# Patient Record
Sex: Female | Born: 1950 | Race: White | Hispanic: No | Smoking: Never smoker
Health system: Southern US, Community
[De-identification: ages and names within clinical notes are randomized; demographics above are authoritative.]

## PROBLEM LIST (undated history)

## (undated) DIAGNOSIS — M8580 Other specified disorders of bone density and structure, unspecified site: Secondary | ICD-10-CM

## (undated) DIAGNOSIS — B009 Herpesviral infection, unspecified: Secondary | ICD-10-CM

## (undated) DIAGNOSIS — Z8639 Personal history of other endocrine, nutritional and metabolic disease: Secondary | ICD-10-CM

## (undated) DIAGNOSIS — Z87898 Personal history of other specified conditions: Secondary | ICD-10-CM

## (undated) HISTORY — DX: Personal history of other specified conditions: Z87.898

## (undated) HISTORY — DX: Other specified disorders of bone density and structure, unspecified site: M85.80

## (undated) HISTORY — DX: Personal history of other endocrine, nutritional and metabolic disease: Z86.39

## (undated) HISTORY — DX: Herpesviral infection, unspecified: B00.9

## (undated) HISTORY — PX: SALPINGECTOMY: SHX328

## (undated) HISTORY — PX: WRIST SURGERY: SHX841

---

## 1982-10-04 HISTORY — PX: CERVICAL CONIZATION W/BX: SHX1330

## 1986-10-04 HISTORY — PX: TUBAL LIGATION: SHX77

## 2008-11-27 ENCOUNTER — Other Ambulatory Visit: Admission: RE | Admit: 2008-11-27 | Discharge: 2008-11-27 | Payer: Self-pay | Admitting: Obstetrics & Gynecology

## 2009-01-23 ENCOUNTER — Ambulatory Visit (HOSPITAL_COMMUNITY): Admission: RE | Admit: 2009-01-23 | Discharge: 2009-01-23 | Payer: Self-pay | Admitting: Gastroenterology

## 2009-01-26 LAB — HM DEXA SCAN

## 2010-10-24 ENCOUNTER — Encounter: Payer: Self-pay | Admitting: Specialist

## 2011-02-16 NOTE — Op Note (Signed)
NAME:  [facility] MD, [facility]             ACCOUNT NO.:  [id number]   MEDICAL RECORD NO.:  [id number]          PATIENT TYPE:  AMB   LOCATION:  ENDO                         FACILITY:  [facility]   PHYSICIAN:  John C. Madilyn Fireman, M.D.    DATE OF BIRTH:  1950-12-22   DATE OF PROCEDURE:  DATE OF DISCHARGE:                               OPERATIVE REPORT   PROCEDURE:  Colonoscopy.   INDICATIONS FOR PROCEDURE:  Average risk colon cancer screening in a 45-  year-old patient with no prior screening.   PROCEDURE:  The patient was placed in the left lateral decubitus  position and placed on the pulse monitor with continuous low-flow oxygen  delivered via nasal cannula.  She was sedated with 100 mcg IV fentanyl  and 10 mg IV Versed.  The Olympus video colonoscope was inserted into  the rectum and advanced to the cecum, confirmed by transillumination of  McBurney's point and visualization of the ileocecal valve and  appendiceal orifice.  The prep was good.  The cecum, ascending,  transverse, descending, sigmoid and rectum all appeared normal with no  masses, polyps, diverticula or other mucosal abnormalities.  The rectum  likewise appeared normal and the retroflexed view of the anus revealed  no obvious internal hemorrhoids.  The scope was then withdrawn and the  patient returned to the recovery room in stable condition.  She  tolerated the procedure well and there were no immediate complications.   IMPRESSION:  Normal screening colonoscopy.   PLAN:  Repeat colonoscopy within 10 years and consider annual Hemoccults  beginning in 5 years with colonoscopy in the event of any heme  positivity.           ______________________________  Everardo All Madilyn Fireman, M.D.     [facility]/MEDQ  D:  01/23/2009  T:  01/23/2009  Job:  [id number]   cc:   M. Leda Quail, MD  Fax: [phone]   Neta Mends. Fabian Sharp, MD  [address] [place], [place] [address]

## 2012-02-10 LAB — HM PAP SMEAR: HM Pap smear: NEGATIVE

## 2012-06-08 LAB — HM MAMMOGRAPHY: HM Mammogram: NEGATIVE

## 2013-03-01 ENCOUNTER — Encounter: Payer: Self-pay | Admitting: Obstetrics & Gynecology

## 2013-03-01 ENCOUNTER — Ambulatory Visit (INDEPENDENT_AMBULATORY_CARE_PROVIDER_SITE_OTHER): Payer: 59 | Admitting: Obstetrics & Gynecology

## 2013-03-01 VITALS — BP 122/78 | HR 68 | Resp 16 | Ht 63.0 in | Wt 149.0 lb

## 2013-03-01 DIAGNOSIS — Z01419 Encounter for gynecological examination (general) (routine) without abnormal findings: Secondary | ICD-10-CM

## 2013-03-01 MED ORDER — ESTRADIOL 0.1 MG/GM VA CREA: TOPICAL_CREAM | VAGINAL | Status: DC

## 2013-03-01 MED ORDER — VALACYCLOVIR HCL 1 G PO TABS: 1000.0000 mg | ORAL_TABLET | Freq: Every day | ORAL | Status: DC

## 2013-03-01 NOTE — Patient Instructions (Signed)

## 2013-03-01 NOTE — Progress Notes (Signed)
62 y.o. [id number] MarriedCaucasianF here for annual exam.  No vaginal bleeding.  Did "doctor's day" labs.  Forgot to bring these for me to look at.  Did CBC, CMP, TSH, Lipids.    No LMP recorded. Patient is postmenopausal.          Sexually active: yes  The current method of family planning is tubal ligation.    Exercising: yes  Home exercise routine includes walking 1.5 hrs per hours per week. Smoker:  no  Health Maintenance: Pap:  02/10/12 neg HPV History of abnormal Pap:  yes MMG:  06/08/12 Colonoscopy:  01/2009--10 year recheck BMD:  01/16/09 TDaP:  11/26/08, has not done zostavax Screening Labs: has copy of labs, Hb today: declined Urine today: declined   reports that she has never smoked. She has never used smokeless tobacco. She reports that  drinks alcohol. She reports that she does not use illicit drugs.  Past Medical History  Diagnosis Date  . HSV-2 infection   . History of subacute thyroiditis     resolved by itself  . History of abnormal Pap smear     CIN III, cervical cone 1984    Past Surgical History  Procedure Laterality Date  . Cesarean section  [phone]  . Salpingectomy Right     ectopic pregnancy  . Cervical conization w/bx  1984    CIN III  . Wrist surgery Left   . Tubal ligation Bilateral 1988    reversal in 1991    Current Outpatient Prescriptions  Medication Sig Dispense Refill  . estradiol (ESTRACE) 0.1 MG/GM vaginal cream as needed.      . ValACYclovir HCl (VALTREX PO) Take by mouth.       No current facility-administered medications for this visit.    Family History  Problem Relation Age of Onset  . Lung cancer Mother   . Hypertension Mother   . Stroke Father     embolic, complications from a-fib    ROS:  Pertinent items are noted in HPI.  Otherwise, a comprehensive ROS was negative.  Exam:   BP 122/78  Pulse 68  Resp 16  Ht 5\' 3"  (1.6 m)  Wt 149 lb (67.586 kg)  BMI 26.4 kg/m2  Weight change: +2lbs   Height: 5\' 3"  (160 cm)  Ht  Readings from Last 3 Encounters:  03/01/13 5\' 3"  (1.6 m)    General appearance: alert, cooperative and appears stated age Head: Normocephalic, without obvious abnormality, atraumatic Neck: no adenopathy, supple, symmetrical, trachea midline and thyroid normal to inspection and palpation Lungs: clear to auscultation bilaterally Breasts: normal appearance, no masses or tenderness Heart: regular rate and rhythm Abdomen: soft, non-tender; bowel sounds normal; no masses,  no organomegaly Extremities: extremities normal, atraumatic, no cyanosis or edema Skin: Skin color, texture, turgor normal. No rashes or lesions Lymph nodes: Cervical, supraclavicular, and axillary nodes normal. No abnormal inguinal nodes palpated Neurologic: Grossly normal   Pelvic: External genitalia:  no lesions              Urethra:  normal appearing urethra with no masses, tenderness or lesions              Bartholins and Skenes: normal                 Vagina: normal appearing vagina with normal color and discharge, no lesions              Cervix: no lesions  Pap taken: no Bimanual Exam:  Uterus:  normal size, contour, position, consistency, mobility, non-tender              Adnexa: normal adnexa and no mass, fullness, tenderness               Rectovaginal: Confirms               Anus:  normal sphincter tone, no lesions  A:  Well Woman with normal exam PMP, no HRT H/O HSVII  P:   Mammogram yearly. No pap smear today.  Neg with neg HR HPV 02/10/12. BMD next year Valtrex Rx and Estrace cream rx to pharmacy. return annually or prn   An After Visit Summary was printed and given to the patient.

## 2013-03-05 ENCOUNTER — Encounter: Payer: Self-pay | Admitting: Obstetrics & Gynecology

## 2013-08-09 ENCOUNTER — Other Ambulatory Visit: Payer: Self-pay

## 2014-03-28 ENCOUNTER — Encounter: Payer: Self-pay | Admitting: Obstetrics & Gynecology

## 2014-03-28 ENCOUNTER — Ambulatory Visit (INDEPENDENT_AMBULATORY_CARE_PROVIDER_SITE_OTHER): Payer: 59 | Admitting: Obstetrics & Gynecology

## 2014-03-28 VITALS — BP 116/68 | HR 60 | Resp 16 | Ht 63.0 in | Wt 149.0 lb

## 2014-03-28 DIAGNOSIS — Z01419 Encounter for gynecological examination (general) (routine) without abnormal findings: Secondary | ICD-10-CM

## 2014-03-28 DIAGNOSIS — Z124 Encounter for screening for malignant neoplasm of cervix: Secondary | ICD-10-CM

## 2014-03-28 MED ORDER — VALACYCLOVIR HCL 1 G PO TABS: 1000.0000 mg | ORAL_TABLET | Freq: Every day | ORAL | Status: DC

## 2014-03-28 MED ORDER — ESTRADIOL 0.1 MG/GM VA CREA: TOPICAL_CREAM | VAGINAL | Status: DC

## 2014-03-28 NOTE — Patient Instructions (Signed)

## 2014-03-28 NOTE — Progress Notes (Signed)
Patient ID: Sarah [name], female   DOB: 06/21/1951, 63 y.o.   MRN: [id number]  63 y.o. [id number] MarriedCaucasianF here for annual exam.  No vaginal bleeding.  No LMP recorded. Patient is postmenopausal.          Sexually active: yes  The current method of family planning is tubal ligation.  Exercising: yes Home exercise routine includes walking and weights. Smoker: no   Health Maintenance:  Pap: 02/10/12 WNL/negative HR HPV History of abnormal Pap: yes h/o cervical conization  MMG: 07/05/13 3D-normal Colonoscopy: 01/2009--10 year recheck  BMD: 01/16/09  TDaP: 11/26/08, has not done zostavax  Screening Labs: done at "doctors day", Hb today: N/A Urine today:  N/A   reports that she has never smoked. She has never used smokeless tobacco. She reports that she drinks alcohol. She reports that she does not use illicit drugs.  Past Medical History  Diagnosis Date  . HSV-2 infection   . History of subacute thyroiditis     resolved by itself  . History of abnormal Pap smear     CIN III, cervical cone 1984    Past Surgical History  Procedure Laterality Date  . Cesarean section  [phone],84,88,94  . Salpingectomy Right     ectopic pregnancy  . Cervical conization w/bx  1984    CIN III  . Wrist surgery Left   . Tubal ligation Bilateral 1988    reversal in 1991    Current Outpatient Prescriptions  Medication Sig Dispense Refill  . estradiol (ESTRACE) 0.1 MG/GM vaginal cream 1 gram pv twice weekly  42.5 g  3  . valACYclovir (VALTREX) 1000 MG tablet Take 1 tablet (1,000 mg total) by mouth daily.  90 tablet  4   No current facility-administered medications for this visit.    Family History  Problem Relation Age of Onset  . Lung cancer Mother   . Hypertension Mother   . Stroke Father     embolic, complications from a-fib    ROS:  Pertinent items are noted in HPI.  Otherwise, a comprehensive ROS was negative.  Exam:   General appearance: alert, cooperative and appears stated  age Head: Normocephalic, without obvious abnormality, atraumatic Neck: no adenopathy, supple, symmetrical, trachea midline and thyroid normal to inspection and palpation Lungs: clear to auscultation bilaterally Breasts: normal appearance, no masses or tenderness Heart: regular rate and rhythm Abdomen: soft, non-tender; bowel sounds normal; no masses,  no organomegaly Extremities: extremities normal, atraumatic, no cyanosis or edema Skin: Skin color, texture, turgor normal. No rashes or lesions Lymph nodes: Cervical, supraclavicular, and axillary nodes normal. No abnormal inguinal nodes palpated Neurologic: Grossly normal   Pelvic: External genitalia:  no lesions              Urethra:  normal appearing urethra with no masses, tenderness or lesions              Bartholins and Skenes: normal                 Vagina: normal appearing vagina with normal color and discharge, no lesions              Cervix: no lesions              Pap taken: Yes.   Bimanual Exam:  Uterus:  normal size, contour, position, consistency, mobility, non-tender              Adnexa: normal adnexa and no mass, fullness, tenderness  Rectovaginal: Confirms               Anus:  normal sphincter tone, no lesions  A:  Well Woman with normal exam  PMP, no HRT  H/O HSVII   P: Mammogram yearly pap smear today. Neg with neg HR HPV 02/10/12.  BMD next year  Valtrex Rx and Estrace cream rx to pharmacy.  return annually or prn  An After Visit Summary was printed and given to the patient.

## 2014-04-02 LAB — IPS PAP SMEAR ONLY

## 2014-04-09 ENCOUNTER — Telehealth: Payer: Self-pay

## 2014-04-09 NOTE — Telephone Encounter (Signed)
Per DPR ok to leave detailed message on cell#-left message that her MMG and BMD are [facility] on 07/11/14 at 1:00//kn

## 2014-07-09 ENCOUNTER — Encounter: Payer: Self-pay | Admitting: Obstetrics & Gynecology

## 2014-07-19 ENCOUNTER — Other Ambulatory Visit: Payer: Self-pay

## 2014-08-05 ENCOUNTER — Encounter: Payer: Self-pay | Admitting: Obstetrics & Gynecology

## 2014-10-03 DIAGNOSIS — M79673 Pain in unspecified foot: Secondary | ICD-10-CM

## 2015-01-23 ENCOUNTER — Encounter: Payer: Self-pay | Admitting: Obstetrics & Gynecology

## 2015-04-18 ENCOUNTER — Ambulatory Visit: Payer: 59 | Admitting: Obstetrics & Gynecology

## 2015-05-02 ENCOUNTER — Ambulatory Visit (INDEPENDENT_AMBULATORY_CARE_PROVIDER_SITE_OTHER): Payer: 59 | Admitting: Obstetrics & Gynecology

## 2015-05-02 ENCOUNTER — Encounter: Payer: Self-pay | Admitting: Obstetrics & Gynecology

## 2015-05-02 VITALS — BP 122/80 | HR 56 | Resp 16 | Ht 63.0 in | Wt 150.0 lb

## 2015-05-02 DIAGNOSIS — Z01419 Encounter for gynecological examination (general) (routine) without abnormal findings: Secondary | ICD-10-CM | POA: Diagnosis not present

## 2015-05-02 MED ORDER — VALACYCLOVIR HCL 1 G PO TABS: 1000.0000 mg | ORAL_TABLET | Freq: Every day | ORAL | Status: DC

## 2015-05-02 MED ORDER — ESTRADIOL 0.1 MG/GM VA CREA: TOPICAL_CREAM | VAGINAL | Status: DC

## 2015-05-02 NOTE — Progress Notes (Signed)
64 y.o. [id number] MarriedCaucasianF here for annual exam.  Pt's oldest living child (daughter) got married two weeks ago.  She is an Pensions consultant.  Wedding was nice.    No vaginal bleeding.  Doing well.  Work is stressful but this is not new.   Discussed options for PCP for pt.  No LMP recorded. Patient is postmenopausal.          Sexually active: Yes.    The current method of family planning is tubal ligation.    Exercising: Yes.    walking, weights, and work Smoker:  no  Health Maintenance: Pap:  03/28/14 WNL History of abnormal Pap:  Yes h/o conization MMG:  07/11/14-BiRads 1-normal Colonoscopy:  4/10-repeat in 10 years BMD:   07/11/14 TDaP:  11/26/08 Screening Labs: PCP, Hb today: PCP, Urine today: PCP   reports that she has never smoked. She has never used smokeless tobacco. She reports that she drinks about 3.0 oz of alcohol per week. She reports that she does not use illicit drugs.  Past Medical History  Diagnosis Date  . HSV-2 infection   . History of subacute thyroiditis     resolved by itself  . History of abnormal Pap smear     CIN III, cervical cone 1984    Past Surgical History  Procedure Laterality Date  . Cesarean section  [phone]  . Salpingectomy Right     ectopic pregnancy  . Cervical conization w/bx  1984    CIN III  . Wrist surgery Left   . Tubal ligation Bilateral 1988    reversal in 1991    Current Outpatient Prescriptions  Medication Sig Dispense Refill  . estradiol (ESTRACE) 0.1 MG/GM vaginal cream 1 gram pv twice weekly 42.5 g 3  . valACYclovir (VALTREX) 1000 MG tablet Take 1 tablet (1,000 mg total) by mouth daily. 90 tablet 4   No current facility-administered medications for this visit.    Family History  Problem Relation Age of Onset  . Lung cancer Mother   . Hypertension Mother   . Stroke Father     embolic, complications from a-fib  . Thyroid nodules Mother     ROS:  Pertinent items are noted in HPI.  Otherwise, a comprehensive ROS  was negative.  Exam:   BP 122/80 mmHg  Pulse 56  Resp 16  Ht 5\' 3"  (1.6 m)  Wt 150 lb (68.04 kg)  BMI 26.58 kg/m2  LMP   Height: 5\' 3"  (160 cm)  Ht Readings from Last 3 Encounters:  05/02/15 5\' 3"  (1.6 m)  03/28/14 5\' 3"  (1.6 m)  03/01/13 5\' 3"  (1.6 m)    General appearance: alert, cooperative and appears stated age Head: Normocephalic, without obvious abnormality, atraumatic Neck: no adenopathy, supple, symmetrical, trachea midline and thyroid normal to inspection and palpation Lungs: clear to auscultation bilaterally Breasts: normal appearance, no masses or tenderness Heart: regular rate and rhythm Abdomen: soft, non-tender; bowel sounds normal; no masses,  no organomegaly Extremities: extremities normal, atraumatic, no cyanosis or edema Skin: Skin color, texture, turgor normal. No rashes or lesions Lymph nodes: Cervical, supraclavicular, and axillary nodes normal. No abnormal inguinal nodes palpated Neurologic: Grossly normal   Pelvic: External genitalia:  no lesions              Urethra:  normal appearing urethra with no masses, tenderness or lesions              Bartholins and Skenes: normal  Vagina: normal appearing vagina with normal color and discharge, no lesions              Cervix: no lesions              Pap taken: No. Bimanual Exam:  Uterus:  normal size, contour, position, consistency, mobility, non-tender              Adnexa: normal adnexa and no mass, fullness, tenderness               Rectovaginal: Confirms               Anus:  normal sphincter tone, no lesions  Chaperone was present for exam.  A:  Well Woman with normal exam  PMP, no HRT  H/O HSV II   P: Mammogram yearly pap smear today. Neg Pap with neg HR HPV 02/10/12.  BMD next year  Valtrex Rx and Estrace cream rx to pharmacy.  return annually or prn

## 2015-11-26 MED FILL — valACYclovir HCL 1 GM TABS: 1 | 90 days supply | Qty: 90 | Fill #1

## 2016-02-04 ENCOUNTER — Telehealth: Payer: Self-pay | Admitting: Obstetrics & Gynecology

## 2016-02-04 NOTE — Telephone Encounter (Signed)
Left message on voicemail to call and reschedule cancelled appointment. °

## 2016-02-09 ENCOUNTER — Telehealth: Payer: Self-pay | Admitting: Obstetrics & Gynecology

## 2016-02-09 NOTE — Telephone Encounter (Signed)
Patient left message on 02/05/16 to reschedule a dr cancel reschedule appointment. 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 She can come on Thursday or Friday afternoons only. She only wants to see dr. Radford PaxMSM.

## 2016-03-25 MED FILL — valACYclovir HCL 1 GM TABS: 1 | 90 days supply | Qty: 90 | Fill #2

## 2016-04-22 DIAGNOSIS — Z1231 Encounter for screening mammogram for malignant neoplasm of breast: Secondary | ICD-10-CM | POA: Diagnosis not present

## 2016-05-06 ENCOUNTER — Encounter: Payer: Self-pay | Admitting: Obstetrics & Gynecology

## 2016-05-07 ENCOUNTER — Ambulatory Visit: Payer: 59 | Admitting: Obstetrics & Gynecology

## 2016-05-14 ENCOUNTER — Ambulatory Visit (INDEPENDENT_AMBULATORY_CARE_PROVIDER_SITE_OTHER): Payer: 59 | Admitting: Obstetrics & Gynecology

## 2016-05-14 ENCOUNTER — Encounter: Payer: Self-pay | Admitting: Obstetrics & Gynecology

## 2016-05-14 VITALS — HR 64 | Resp 12 | Ht 62.75 in | Wt 145.0 lb

## 2016-05-14 DIAGNOSIS — Z01419 Encounter for gynecological examination (general) (routine) without abnormal findings: Secondary | ICD-10-CM

## 2016-05-14 DIAGNOSIS — Z0000 Encounter for general adult medical examination without abnormal findings: Secondary | ICD-10-CM | POA: Diagnosis not present

## 2016-05-14 DIAGNOSIS — Z124 Encounter for screening for malignant neoplasm of cervix: Secondary | ICD-10-CM | POA: Diagnosis not present

## 2016-05-14 DIAGNOSIS — Z1151 Encounter for screening for human papillomavirus (HPV): Secondary | ICD-10-CM | POA: Diagnosis not present

## 2016-05-14 MED ORDER — ESTRADIOL 0.1 MG/GM VA CREA: TOPICAL_CREAM | VAGINAL | 3 refills | Status: DC

## 2016-05-14 MED ORDER — VALACYCLOVIR HCL 1 G PO TABS: 1000.0000 mg | ORAL_TABLET | Freq: Every day | ORAL | 4 refills | Status: DC

## 2016-05-14 MED FILL — ESTRACE 0.01% CREAM: 0.1 | 90 days supply | Qty: 43 | Fill #0

## 2016-05-14 NOTE — Progress Notes (Signed)
65 y.o. [id number] MarriedCaucasianF here for annual exam.  Doing well.  No vaginal bleeding.    Patient's last menstrual period was 10/05/2003.          Sexually active: Yes.    The current method of family planning is tubal ligation and post menopausal status.    Exercising: Yes.    Walking, Weights Smoker:  no  Health Maintenance: Pap:  03/28/14 Neg.  History of abnormal Pap:  Yes, h/o conization  MMG:  04/22/16 BIRADS1:Neg  Colonoscopy:  01/2009 f/u 10 years  BMD:   07/11/14 Osteopenia  TDaP:  11/27/08  Pneumonia vaccine(s):  No Zostavax:   No Hep C testing: Pt will do with her next blood draw Screening Labs: Done.    reports that she has never smoked. She has never used smokeless tobacco. She reports that she drinks about 3.0 oz of alcohol per week . She reports that she does not use drugs.  Past Medical History:  Diagnosis Date  . History of abnormal Pap smear    CIN III, cervical cone 1984  . History of subacute thyroiditis    resolved by itself  . HSV-2 infection     Past Surgical History:  Procedure Laterality Date  . CERVICAL CONIZATION W/BX  1984   CIN III  . CESAREAN SECTION  [phone]  . SALPINGECTOMY Right    ectopic pregnancy  . TUBAL LIGATION Bilateral 1988   reversal in 1991  . WRIST SURGERY Left     Current Outpatient Prescriptions  Medication Sig Dispense Refill  . estradiol (ESTRACE) 0.1 MG/GM vaginal cream 1 gram pv twice weekly 42.5 g 3  . valACYclovir (VALTREX) 1000 MG tablet Take 1 tablet (1,000 mg total) by mouth daily. 90 tablet 4   No current facility-administered medications for this visit.     Family History  Problem Relation Age of Onset  . Lung cancer Mother   . Hypertension Mother   . Thyroid nodules Mother   . Stroke Father     embolic, complications from a-fib    ROS:  Pertinent items are noted in HPI.  Otherwise, a comprehensive ROS was negative.  Exam:   BP (P) 120/76 (BP Location: Right Arm, Patient Position: Sitting, Cuff  Size: Normal)   Pulse 64   Resp 12   Ht 5' 2.75" (1.594 m)   Wt 145 lb (65.8 kg)   LMP 10/05/2003   BMI 25.89 kg/m   Weight change: -5#  Height: 5' 2.75" (159.4 cm)  Ht Readings from Last 3 Encounters:  05/14/16 5' 2.75" (1.594 m)  05/02/15  (1.6 m)  03/28/14  (1.6 m)    General appearance: alert, cooperative and appears stated age Head: Normocephalic, without obvious abnormality, atraumatic Neck: no adenopathy, supple, symmetrical, trachea midline and thyroid normal to inspection and palpation Lungs: clear to auscultation bilaterally Breasts: normal appearance, no masses or tenderness Heart: regular rate and rhythm Abdomen: soft, non-tender; bowel sounds normal; no masses,  no organomegaly Extremities: extremities normal, atraumatic, no cyanosis or edema Skin: Skin color, texture, turgor normal. No rashes or lesions Lymph nodes: Cervical, supraclavicular, and axillary nodes normal. No abnormal inguinal nodes palpated Neurologic: Grossly normal   Pelvic: External genitalia:  no lesions              Urethra:  normal appearing urethra with no masses, tenderness or lesions              Bartholins and Skenes: normal  Vagina: normal appearing vagina with normal color and discharge, no lesions              Cervix: no lesions              Pap taken: Yes.   Bimanual Exam:  Uterus:  normal size, contour, position, consistency, mobility, non-tender              Adnexa: normal adnexa and no mass, fullness, tenderness               Rectovaginal: Confirms               Anus:  normal sphincter tone, no lesions  Chaperone was present for exam.  A:              Well Woman with normal exam  PMP, no HRT  Vaginal atrophic changes H/O HSV II   P: Mammogram guidelines discussed Pap and HR HPV today BMD testing discussed. Order for Zostavax vaccine given.  Pt did have chicken pox as a child. Valtrex Rx and Estrace cream rx to pharmacy.  Return annually or  prn

## 2016-05-18 LAB — IPS PAP TEST WITH HPV

## 2016-06-09 ENCOUNTER — Encounter: Payer: Self-pay | Admitting: Obstetrics & Gynecology

## 2016-06-09 NOTE — Telephone Encounter (Signed)
Routing to LulaAndrea for further review and discussion with patient.

## 2016-07-22 DIAGNOSIS — H40013 Open angle with borderline findings, low risk, bilateral: Secondary | ICD-10-CM | POA: Diagnosis not present

## 2016-07-22 DIAGNOSIS — H2513 Age-related nuclear cataract, bilateral: Secondary | ICD-10-CM | POA: Diagnosis not present

## 2016-08-12 MED FILL — valACYclovir HCL 1 GM TABS: 1 | 90 days supply | Qty: 90 | Fill #0

## 2017-03-23 MED FILL — valACYclovir HCL 1 GM TABS: 1 | 90 days supply | Qty: 90 | Fill #1

## 2017-05-27 ENCOUNTER — Ambulatory Visit: Payer: 59 | Admitting: Obstetrics & Gynecology

## 2017-06-13 ENCOUNTER — Encounter: Payer: Self-pay | Admitting: Obstetrics & Gynecology

## 2017-06-13 ENCOUNTER — Ambulatory Visit (INDEPENDENT_AMBULATORY_CARE_PROVIDER_SITE_OTHER): Payer: 59 | Admitting: Obstetrics & Gynecology

## 2017-06-13 VITALS — BP 108/70 | HR 72 | Resp 12 | Ht 63.0 in | Wt 151.0 lb

## 2017-06-13 DIAGNOSIS — Z01419 Encounter for gynecological examination (general) (routine) without abnormal findings: Secondary | ICD-10-CM | POA: Diagnosis not present

## 2017-06-13 MED ORDER — ESTRADIOL 0.1 MG/GM VA CREA: TOPICAL_CREAM | VAGINAL | 3 refills | Status: DC

## 2017-06-13 MED ORDER — VALACYCLOVIR HCL 1 G PO TABS: 1000.0000 mg | ORAL_TABLET | Freq: Every day | ORAL | 4 refills | Status: DC

## 2017-06-13 MED FILL — valACYclovir HCL 1 GM TABS: 1 | 90 days supply | Qty: 90 | Fill #0

## 2017-06-13 MED FILL — ESTRADIOL 0.1 MG/GM CRM: 0.1 | 90 days supply | Qty: 43 | Fill #0

## 2017-06-13 NOTE — Progress Notes (Signed)
66 y.o. [id number] MarriedCaucasianF here for annual exam.  Doing well.  Doesn't have PCP.  Denies vaginal bleeding.    Patient's last menstrual period was 10/05/2003.          Sexually active: Yes.    The current method of family planning is tubal ligation and post menopausal status.    Exercising: Yes.    walking, weights  Smoker:  no  Health Maintenance: Pap: 05/14/16 Pap and HR HPV Negative  03/28/14 Neg. History of abnormal Pap:  Yes, h/o conization  MMG:  04/22/16 BIRADS 1 negative/density a -- scheduled for October 11 per [facility] Colonoscopy:  01/2009 f/u 10 years  BMD:   07/11/14 Osteopenia  TDaP:   11/27/08 Pneumonia vaccine(s):  never Zostavax:   never Hep C testing: discuss with provider Screening Labs: done 12/2016, Hb today: done 12/2016   reports that she has never smoked. She has never used smokeless tobacco. She reports that she drinks about 3.0 oz of alcohol per week . She reports that she does not use drugs.  Past Medical History:  Diagnosis Date  . History of abnormal Pap smear    CIN III, cervical cone 1984  . History of subacute thyroiditis    resolved by itself  . HSV-2 infection     Past Surgical History:  Procedure Laterality Date  . CERVICAL CONIZATION W/BX  1984   CIN III  . CESAREAN SECTION  [phone]  . SALPINGECTOMY Right    ectopic pregnancy  . TUBAL LIGATION Bilateral 1988   reversal in 1991  . WRIST SURGERY Left     Current Outpatient Prescriptions  Medication Sig Dispense Refill  . estradiol (ESTRACE) 0.1 MG/GM vaginal cream 1 gram pv twice weekly 42.5 g 3  . valACYclovir (VALTREX) 1000 MG tablet Take 1 tablet (1,000 mg total) by mouth daily. 90 tablet 4   No current facility-administered medications for this visit.     Family History  Problem Relation Age of Onset  . Lung cancer Mother   . Hypertension Mother   . Thyroid nodules Mother   . Stroke Father        embolic, complications from a-fib    ROS:  Pertinent items are noted in  HPI.  Otherwise, a comprehensive ROS was negative.  Exam:   BP 108/70 (BP Location: Right Arm, Patient Position: Sitting, Cuff Size: Normal)   Pulse 72   Resp 12   Ht  (1.6 m)   Wt 151 lb (68.5 kg)   LMP 10/05/2003   BMI 26.75 kg/m   Weight change: @ Height:   Height:  (160 cm)  Ht Readings from Last 3 Encounters:  06/13/17  (1.6 m)  05/14/16 5' 2.75" (1.594 m)  05/02/15  (1.6 m)    General appearance: alert, cooperative and appears stated age Head: Normocephalic, without obvious abnormality, atraumatic Neck: no adenopathy, supple, symmetrical, trachea midline and thyroid normal to inspection and palpation Lungs: clear to auscultation bilaterally Breasts: normal appearance, no masses or tenderness Heart: regular rate and rhythm Abdomen: soft, non-tender; bowel sounds normal; no masses,  no organomegaly Extremities: extremities normal, atraumatic, no cyanosis or edema Skin: Skin color, texture, turgor normal. No rashes or lesions Lymph nodes: Cervical, supraclavicular, and axillary nodes normal. No abnormal inguinal nodes palpated Neurologic: Grossly normal   Pelvic: External genitalia:  no lesions              Urethra:  normal appearing urethra with no masses, tenderness or lesions  Bartholins and Skenes: normal                 Vagina: normal appearing vagina with normal color and discharge, no lesions              Cervix: no lesions              Pap taken: No. Bimanual Exam:  Uterus:  normal size, contour, position, consistency, mobility, non-tender              Adnexa: normal adnexa and no mass, fullness, tenderness               Rectovaginal: Confirms               Anus:  normal sphincter tone, no lesions  Chaperone was present for exam.  A:  Well Woman with normal exam PMP, no HRT H/O HSV 2 Atrophic changes  P:   Mammogram guidelines reviewed.  Pt has appt scheduled. pap smear not obtained today.  Neg pap and Neg HR  HPV 2017. Order for Shingrix and prevnar vaccines given today Order for Hep C obtained. Valtrex and estrace cream prescriptions to pharmacy. return annually or prn

## 2017-07-14 DIAGNOSIS — Z1231 Encounter for screening mammogram for malignant neoplasm of breast: Secondary | ICD-10-CM | POA: Diagnosis not present

## 2017-08-04 ENCOUNTER — Encounter: Payer: Self-pay | Admitting: Obstetrics & Gynecology

## 2017-08-13 ENCOUNTER — Encounter: Payer: Self-pay | Admitting: Obstetrics & Gynecology

## 2017-08-15 ENCOUNTER — Encounter: Payer: Self-pay | Admitting: Obstetrics & Gynecology

## 2017-09-06 MED FILL — valACYclovir HCL 1 GM TABS: 1 | 90 days supply | Qty: 90 | Fill #1

## 2017-09-07 MED FILL — ESTRADIOL 0.1 MG/GM CREA: 0.1 | 90 days supply | Qty: 43 | Fill #1

## 2018-03-22 MED FILL — ESTRADIOL 0.1 MG/GM CREA: 0.1 | 90 days supply | Qty: 43 | Fill #2

## 2018-03-22 MED FILL — valACYclovir HCL 1 GM TABS: 1 | 90 days supply | Qty: 90 | Fill #2

## 2018-06-22 ENCOUNTER — Encounter: Payer: Self-pay | Admitting: Obstetrics & Gynecology

## 2018-07-21 ENCOUNTER — Ambulatory Visit: Payer: 59 | Admitting: Obstetrics & Gynecology

## 2018-07-21 ENCOUNTER — Encounter

## 2018-07-27 DIAGNOSIS — Z1231 Encounter for screening mammogram for malignant neoplasm of breast: Secondary | ICD-10-CM | POA: Diagnosis not present

## 2018-08-03 ENCOUNTER — Encounter: Payer: Self-pay | Admitting: Obstetrics & Gynecology

## 2018-08-10 DIAGNOSIS — M8589 Other specified disorders of bone density and structure, multiple sites: Secondary | ICD-10-CM | POA: Diagnosis not present

## 2018-08-30 ENCOUNTER — Ambulatory Visit (INDEPENDENT_AMBULATORY_CARE_PROVIDER_SITE_OTHER): Payer: 59 | Admitting: Obstetrics & Gynecology

## 2018-08-30 ENCOUNTER — Encounter: Payer: Self-pay | Admitting: Obstetrics & Gynecology

## 2018-08-30 ENCOUNTER — Other Ambulatory Visit: Payer: Self-pay

## 2018-08-30 VITALS — BP 138/90 | HR 80 | Resp 16 | Ht 63.0 in | Wt 154.6 lb

## 2018-08-30 DIAGNOSIS — Z23 Encounter for immunization: Secondary | ICD-10-CM

## 2018-08-30 DIAGNOSIS — Z01419 Encounter for gynecological examination (general) (routine) without abnormal findings: Secondary | ICD-10-CM

## 2018-08-30 MED ORDER — ESTRADIOL 0.1 MG/GM VA CREA: TOPICAL_CREAM | VAGINAL | 3 refills | Status: DC

## 2018-08-30 MED ORDER — VALACYCLOVIR HCL 1 G PO TABS: 1000.0000 mg | ORAL_TABLET | Freq: Every day | ORAL | 4 refills | Status: DC

## 2018-08-30 MED FILL — ESTRADIOL 0.1 MG/GM CREA: 0.1 | 90 days supply | Qty: 43 | Fill #0

## 2018-08-30 MED FILL — valACYclovir HCL 1 GM TABS: 1 | 90 days supply | Qty: 90 | Fill #0

## 2018-08-30 NOTE — Progress Notes (Signed)
67 y.o. [id number] Married White or Caucasian female here for annual exam.  Doing well.  Just came back from vacation.  Was on a just over two week trip to [place].  Some of this was a cruise. It was a great trip.    Denies vaginal bleeding.    Still working full time.  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    Did blood work in 2018.  Done with Doctor's Day.    Patient's last menstrual period was 10/05/2003.          Sexually active: Yes.    The current method of family planning is post menopausal status.    Exercising: Yes.     Smoker:  no  Health Maintenance: Pap:  05/14/16 Neg. HR HPV:neg   03/28/14 Neg  History of abnormal Pap:  yes MMG:  07/27/18 BIRADS2:benign.  Colonoscopy:  01/2009 f/u 10 years.  Aware due next year.   BMD:   08/10/18.  Osteopenia with minimal change TDaP:  11/2008 Pneumonia vaccine(s):  D/w pt today.  Declines for now.   Shingrix:   no Hep C testing: No Screening Labs: PCP   reports that she has never smoked. She has never used smokeless tobacco. She reports that she drinks about 6.0 standard drinks of alcohol per week. She reports that she does not use drugs.  Past Medical History:  Diagnosis Date  . History of abnormal Pap smear    CIN III, cervical cone 1984  . History of subacute thyroiditis    resolved by itself  . HSV-2 infection     Past Surgical History:  Procedure Laterality Date  . CERVICAL CONIZATION W/BX  1984   CIN III  . CESAREAN SECTION  [phone],84,88,94  . SALPINGECTOMY Right    ectopic pregnancy  . TUBAL LIGATION Bilateral 1988   reversal in 1991  . WRIST SURGERY Left     Current Outpatient Medications  Medication Sig Dispense Refill  . estradiol (ESTRACE) 0.1 MG/GM vaginal cream 1 gram pv twice weekly 42.5 g 3  . valACYclovir (VALTREX) 1000 MG tablet Take 1 tablet (1,000 mg total) by mouth daily. 90 tablet 4   No current facility-administered medications for this visit.     Family History  Problem Relation Age of Onset   . Lung cancer Mother   . Hypertension Mother   . Thyroid nodules Mother   . Stroke Father        embolic, complications from a-fib    Review of Systems  All other systems reviewed and are negative.   Exam:   BP 138/90 (BP Location: Left Arm, Patient Position: Sitting, Cuff Size: Large)   Pulse 80   Resp 16   Ht 5\' 3"  (1.6 m)   Wt 154 lb 9.6 oz (70.1 kg)   LMP 10/05/2003   BMI 27.39 kg/m   tHeight: 5\' 3"  (160 cm)  Ht Readings from Last 3 Encounters:  08/30/18 5\' 3"  (1.6 m)  06/13/17 5\' 3"  (1.6 m)  05/14/16 5' 2.75" (1.594 m)    General appearance: alert, cooperative and appears stated age Head: Normocephalic, without obvious abnormality, atraumatic Neck: no adenopathy, supple, symmetrical, trachea midline and thyroid normal to inspection and palpation Lungs: clear to auscultation bilaterally Breasts: normal appearance, no masses or tenderness Heart: regular rate and rhythm Abdomen: soft, non-tender; bowel sounds normal; no masses,  no organomegaly Extremities: extremities normal, atraumatic, no cyanosis or edema Skin: Skin color, texture, turgor normal. No rashes or lesions Lymph nodes:  Cervical, supraclavicular, and axillary nodes normal. No abnormal inguinal nodes palpated Neurologic: Grossly normal   Pelvic: External genitalia:  no lesions              Urethra:  normal appearing urethra with no masses, tenderness or lesions              Bartholins and Skenes: normal                 Vagina: normal appearing vagina with normal color and discharge, no lesions              Cervix: no lesions              Pap taken: Yes.   Bimanual Exam:  Uterus:  normal size, contour, position, consistency, mobility, non-tender              Adnexa: normal adnexa and no mass, fullness, tenderness               Rectovaginal: Confirms               Anus:  normal sphincter tone, no lesions  Chaperone was present for exam.  A:  Well Woman with normal exam PMP, on HRT H/O HSV  2 Atrophic changes  P:   Mammogram guidelines reviewed pap smear with neg HR HPV  Rx for prevnar given today Rf for Valtrex 1gm daily.  #90/4RF. RF for estrace vaginal cream given today Tdap updated today return annually or prn

## 2018-10-03 DIAGNOSIS — H40013 Open angle with borderline findings, low risk, bilateral: Secondary | ICD-10-CM | POA: Diagnosis not present

## 2018-10-03 DIAGNOSIS — H2513 Age-related nuclear cataract, bilateral: Secondary | ICD-10-CM | POA: Diagnosis not present

## 2019-02-28 ENCOUNTER — Telehealth (HOSPITAL_COMMUNITY): Payer: Self-pay | Admitting: Cardiology

## 2019-02-28 NOTE — Telephone Encounter (Signed)

## 2019-03-01 ENCOUNTER — Other Ambulatory Visit: Payer: Self-pay

## 2019-03-01 ENCOUNTER — Ambulatory Visit (HOSPITAL_COMMUNITY): Payer: 59 | Attending: Cardiovascular Disease

## 2019-03-01 ENCOUNTER — Other Ambulatory Visit (HOSPITAL_COMMUNITY): Payer: Self-pay | Admitting: Maternal and Fetal Medicine

## 2019-03-01 ENCOUNTER — Other Ambulatory Visit (HOSPITAL_COMMUNITY): Payer: Self-pay | Admitting: Internal Medicine

## 2019-03-01 DIAGNOSIS — R011 Cardiac murmur, unspecified: Secondary | ICD-10-CM | POA: Insufficient documentation

## 2019-03-15 ENCOUNTER — Other Ambulatory Visit: Payer: Self-pay

## 2019-03-15 ENCOUNTER — Ambulatory Visit
Admission: RE | Admit: 2019-03-15 | Discharge: 2019-03-15 | Disposition: A | Discharge status: Home / Self Care | Payer: 59 | Source: Ambulatory Visit | Attending: Internal Medicine | Admitting: Internal Medicine

## 2019-03-15 ENCOUNTER — Ambulatory Visit: Payer: 59 | Admitting: Internal Medicine

## 2019-03-15 ENCOUNTER — Encounter: Payer: Self-pay | Admitting: Internal Medicine

## 2019-03-15 ENCOUNTER — Telehealth: Payer: Self-pay | Admitting: *Deleted

## 2019-03-15 VITALS — BP 152/98 | HR 62

## 2019-03-15 DIAGNOSIS — Z79899 Other long term (current) drug therapy: Secondary | ICD-10-CM | POA: Diagnosis not present

## 2019-03-15 DIAGNOSIS — R0602 Shortness of breath: Secondary | ICD-10-CM

## 2019-03-15 MED ORDER — METOPROLOL SUCCINATE ER 25 MG PO TB24: 25.0000 mg | ORAL_TABLET | Freq: Every day | ORAL | 6 refills | Status: DC

## 2019-03-15 MED FILL — METOPROLOL SUCCINATE ER 25: 25 | 30 days supply | Qty: 30 | Fill #0

## 2019-03-15 NOTE — Progress Notes (Signed)
Cardiology Office Note   Date:  03/15/2019   ID:  Sarah [name], DOB [birth year], MRN [id number]  Cardiologist:  Ladona Horns, [name]    Pt presents on self referral for evaluation of murmur    History of Present Illness: Sarah [name] is a 68 y.o. female with no known cardioac history   Last October she was walking at an event when she felt a buzzing or fluttering in chest   Points L upper parasternal area.   No SOB   No dizziness  Started abruptly then eased off.   Since then she has had intermittently   With stanidnig    Often not early in day.   Again No dizziness  No SOB   Feels like she has to cough  No wheeze   A "pulsetile vibration"     I spoke to her on the phone a few wks ago    Recomm she have an echo   Echo noted below   Normal LV size and thickness  LV is vigorous with some dynamic turbulent outflow through LVOT   Gradient 11 mm at reat.    Based on echo findings I recomm she try to hydrate   She has and thinks this may have helped some      LDL was 73 in 2017  HDL 65  No outpatient medications have been marked as taking for the 03/15/19 encounter (Office Visit) with Fay Records, [name].     Allergies:   Patient has no known allergies.   Past Medical History:  Diagnosis Date  . History of abnormal Pap smear    CIN III, cervical cone 1984  . History of subacute thyroiditis    resolved by itself  . HSV-2 infection     Past Surgical History:  Procedure Laterality Date  . CERVICAL CONIZATION W/BX  1984   CIN III  . CESAREAN SECTION  [phone]  . SALPINGECTOMY Right    ectopic pregnancy  . TUBAL LIGATION Bilateral 1988   reversal in 1991  . WRIST SURGERY Left      Social History:  The patient  reports that she has never smoked. She has never used smokeless tobacco. She reports current alcohol use of about 6.0 standard drinks of alcohol per week. She reports that she does not use drugs.   Family History:  The patient's family history includes  Hypertension in her mother; Lung cancer in her mother; Stroke in her father; Thyroid nodules in her mother.    ROS:  Please see the history of present illness. All other systems are reviewed and  Negative to the above problem except as noted.    PHYSICAL EXAM: VS:  BP (!) 152/98   Pulse 62   LMP 10/05/2003   GEN: Well nourished, well developed, in no acute distress  HEENT: normal  Neck: no JVD, carotid bruits, or masses Cardiac: RRR; no murmurs, rubs, or gallops,no edema   With jumping in place and squat/stand there is a  mild murmur  I-II / VI systolic in L parasternal area to apex   Respiratory:  clear to auscultation bilaterally, normal work of breathing  No wheezes with forced expiration  GI: soft, nontender, nondistended, + BS  No hepatomegaly  MS: no deformity Moving all extremities   Skin: warm and dry, no rash Neuro:  Strength and sensation are intact Psych: euthymic mood, full affect   EKG:  EKG is ordered today.  SR  62 bpm     Lipid Panel No results found for: CHOL, TRIG, HDL, CHOLHDL, VLDL, LDLCALC, LDLDIRECT    Wt Readings from Last 3 Encounters:  08/30/18 154 lb 9.6 oz (70.1 kg)  06/13/17 151 lb (68.5 kg)  05/14/16 145 lb (65.8 kg)      ASSESSMENT AND PLAN:  1  Abnormal chest sensations.  New  Concerning in that regard.   Echo shows LV funciton is vigorous  There is turbulent flow through LVOT   LV is minimally thickened.   ? If she has dynamic obstruction at times (dry, hypercatechol periods)   WIll check labs today ( CBC, BMET, TSH) I would recomm a trial of low dose b blocker   Follow    Stay conditioned  Stay hydrated  Cut back on caffeine  I have reviewed echo wth Sarah [name].  COnsider repeat echo vs MRI   MRI esp if patient is symptomatic.  2  HTN  BP is up at time   Will start b Blocker   Follow   3  HCM   Wll need follow up lipids at some point.   Current medicines are reviewed at length with the patient today.  The patient does not have concerns  regarding medicines.  Signed, Sarah PatesPaula Dontray Haberland, [name]  03/15/2019 3:08 PM    [facility] [address], [place], [place]  [address] Phone: [phone]; Fax: [phone]

## 2019-03-15 NOTE — Patient Instructions (Signed)
Medication Instructions:  Your physician has recommended you make the following change in your medication:  1.  START Toprol XL 25 mg taking 1 tablet daily  If you need a refill on your cardiac medications before your next appointment, please call your pharmacy.   Lab work: TODAY:  BMET, CBC, & TSH  If you have labs (blood work) drawn today and your tests are completely normal, you will receive your results only by: Marland Kitchen MyChart Message (if you have MyChart) OR . A paper copy in the mail If you have any lab test that is abnormal or we need to change your treatment, we will call you to review the results.  Testing/Procedures:  Dr. Harrington Challenger wants you to have a chest x-ray, today.  Go to [facility], [address], [place], [place] [address] before 4:30 and you will not have to have an appointment.  A chest x-ray takes a picture of the organs and structures inside the chest, including the heart, lungs, and blood vessels. This test can show several things, including, whether the heart is enlarges; whether fluid is building up in the lungs; and whether pacemaker / defibrillator leads are still in place.   Follow-Up: At [facility], you and your health needs are our priority.  As part of our continuing mission to provide you with exceptional heart care, we have created designated Provider Care Teams.  These Care Teams include your primary Cardiologist (physician) and Advanced Practice Providers (APPs -  Physician Assistants and Nurse Practitioners) who all work together to provide you with the care you need, when you need it. Marland Kitchen CALL DR. ROSS TO REPORT HOW YOU FEEL  Any Other Special Instructions Will Be Listed Below (If Applicable).

## 2019-03-15 NOTE — Telephone Encounter (Signed)
Pt aware to wear mask to office and stop at screening table in lobby.   COVID-19 Pre-Screening Questions:  . In the past 7 to 10 days have you had a cough,  shortness of breath, headache, congestion, fever (100 or greater) body aches, chills, sore throat, or sudden loss of taste or sense of smell?  NO . Have you been around anyone with known Covid 19.  NO . Have you been around anyone who is awaiting Covid 19 test results in the past 7 to 10 days?  NO . Have you been around anyone who has been exposed to Covid 19, or has mentioned symptoms of Covid 19 within the past 7 to 10 days?  NO  If you have any concerns/questions about symptoms patients report during screening (either on the phone or at threshold). Contact the provider seeing the patient or DOD for further guidance.  If neither are available contact a member of the leadership team.

## 2019-03-16 LAB — BASIC METABOLIC PANEL
BUN/Creatinine Ratio: 17 (ref 12–28)
BUN: 17 mg/dL (ref 8–27)
CO2: 24 mmol/L (ref 20–29)
Calcium: 9.6 mg/dL (ref 8.7–10.3)
Chloride: 103 mmol/L (ref 96–106)
Creatinine, Ser: 1.03 mg/dL — ABNORMAL HIGH (ref 0.57–1.00)
GFR calc Af Amer: 65 mL/min/{1.73_m2} (ref >59)
GFR calc non Af Amer: 56 mL/min/{1.73_m2} — ABNORMAL LOW (ref >59)
Glucose: 83 mg/dL (ref 65–99)
Potassium: 4.7 mmol/L (ref 3.5–5.2)
Sodium: 141 mmol/L (ref 134–144)

## 2019-03-16 LAB — CBC
Hematocrit: 40 % (ref 34.0–46.6)
Hemoglobin: 13.7 g/dL (ref 11.1–15.9)
MCH: 33 pg (ref 26.6–33.0)
MCHC: 34.3 g/dL (ref 31.5–35.7)
MCV: 96 fL (ref 79–97)
Platelets: 270 10*3/uL (ref 150–450)
RBC: 4.15 x10E6/uL (ref 3.77–5.28)
RDW: 12.6 % (ref 11.7–15.4)
WBC: 4.7 10*3/uL (ref 3.4–10.8)

## 2019-03-16 LAB — TSH: TSH: 1.99 u[IU]/mL (ref 0.450–4.500)

## 2019-04-11 MED FILL — ESTRADIOL 0.1 MG/GM CREA: 0.1 | 90 days supply | Qty: 43 | Fill #1

## 2019-04-11 MED FILL — valACYclovir HCL 1 GM TABS: 1 | 90 days supply | Qty: 90 | Fill #1

## 2019-04-11 MED FILL — METOPROLOL SUCCINATE ER 25: 25 | 30 days supply | Qty: 30 | Fill #1

## 2019-05-13 ENCOUNTER — Telehealth: Payer: Self-pay | Admitting: Internal Medicine

## 2019-05-13 DIAGNOSIS — R0602 Shortness of breath: Secondary | ICD-10-CM

## 2019-05-13 DIAGNOSIS — R011 Cardiac murmur, unspecified: Secondary | ICD-10-CM

## 2019-05-13 NOTE — Telephone Encounter (Signed)
Patient shouldl be set up for cardiac MRI Evaluate/ rule out hypertrophic cardiomyopathy  Read :  Dr Margaretann Loveless or Dr Meda Coffee to read  Pt works, Sarah [name] is a better day if possible (confirm)

## 2019-05-14 NOTE — Telephone Encounter (Signed)
Order placed for cardiac MRI.  

## 2019-05-14 NOTE — Addendum Note (Signed)
Addended by: Rodman Key on: 05/14/2019 03:38 PM   Modules accepted: Orders

## 2019-05-15 ENCOUNTER — Encounter: Payer: Self-pay | Admitting: Internal Medicine

## 2019-05-15 NOTE — Telephone Encounter (Signed)
The patient has been scheduled for 06/07/19

## 2019-05-22 MED FILL — METOPROLOL SUCCINATE ER 25: 25 | 30 days supply | Qty: 30 | Fill #2

## 2019-06-06 ENCOUNTER — Telehealth (HOSPITAL_COMMUNITY): Payer: Self-pay | Admitting: Emergency Medicine

## 2019-06-06 NOTE — Telephone Encounter (Signed)
Reaching out to patient to offer assistance regarding upcoming cardiac imaging study; pt verbalizes understanding of appt date/time, parking situation and where to check in, and verified current allergies; name and call back number provided for further questions should they arise Aprile Dickenson RN Navigator Cardiac Imaging [facility] [phone] office [phone] cell 

## 2019-06-07 ENCOUNTER — Ambulatory Visit (HOSPITAL_COMMUNITY)
Admission: RE | Admit: 2019-06-07 | Discharge: 2019-06-07 | Disposition: A | Discharge status: Home / Self Care | Payer: 59 | Source: Ambulatory Visit | Attending: Internal Medicine | Admitting: Internal Medicine

## 2019-06-07 ENCOUNTER — Other Ambulatory Visit: Payer: Self-pay

## 2019-06-07 DIAGNOSIS — I313 Pericardial effusion (noninflammatory): Secondary | ICD-10-CM | POA: Diagnosis not present

## 2019-06-07 DIAGNOSIS — R0602 Shortness of breath: Secondary | ICD-10-CM | POA: Diagnosis not present

## 2019-06-07 DIAGNOSIS — R011 Cardiac murmur, unspecified: Secondary | ICD-10-CM | POA: Insufficient documentation

## 2019-06-07 LAB — CREATININE, SERUM
Creatinine, Ser: 0.98 mg/dL (ref 0.44–1.00)
GFR calc Af Amer: >60 mL/min (ref >60)
GFR calc non Af Amer: 60 mL/min — ABNORMAL LOW (ref >60)

## 2019-06-07 MED ORDER — GADOBUTROL 1 MMOL/ML IV SOLN
8.0000 mL | Freq: Once | INTRAVENOUS | Status: AC | PRN
  Administered 2019-06-07: 8 mL via INTRAVENOUS

## 2019-06-14 ENCOUNTER — Telehealth: Payer: Self-pay | Admitting: *Deleted

## 2019-06-14 DIAGNOSIS — R0602 Shortness of breath: Secondary | ICD-10-CM

## 2019-06-14 NOTE — Telephone Encounter (Signed)
Order placed for 30 day event monitor

## 2019-06-14 NOTE — Telephone Encounter (Signed)
-----   Message from Fay Records, MD sent at 06/13/2019  3:10 PM EDT ----- Reviewed with pt WOuld recomm a 30 day event monitor to evaluate for arrhythmias

## 2019-06-14 NOTE — Telephone Encounter (Signed)
[employer] to ship a 30 day cardiac event monitor to the patients home.  Instructions reviewed briefly as they are included in the monitor kit. 

## 2019-06-25 ENCOUNTER — Ambulatory Visit (INDEPENDENT_AMBULATORY_CARE_PROVIDER_SITE_OTHER): Payer: 59

## 2019-06-25 DIAGNOSIS — I499 Cardiac arrhythmia, unspecified: Secondary | ICD-10-CM | POA: Diagnosis not present

## 2019-06-25 DIAGNOSIS — R002 Palpitations: Secondary | ICD-10-CM | POA: Diagnosis not present

## 2019-06-25 DIAGNOSIS — R0602 Shortness of breath: Secondary | ICD-10-CM

## 2019-06-29 ENCOUNTER — Other Ambulatory Visit: Payer: Self-pay | Admitting: Occupational Medicine

## 2019-06-30 LAB — LIPID PANEL
Cholesterol: 181 mg/dL (ref <200)
HDL: 69 mg/dL (ref >=50)
LDL Cholesterol (Calc): 98 mg/dL (calc)
Non-HDL Cholesterol (Calc): 112 mg/dL (calc) (ref <130)
Total CHOL/HDL Ratio: 2.6 (calc) (ref <5.0)
Triglycerides: 54 mg/dL (ref <150)

## 2019-06-30 LAB — COMPLETE METABOLIC PANEL WITH GFR
AG Ratio: 2.1 (calc) (ref 1.0–2.5)
ALT: 21 U/L (ref 6–29)
AST: 25 U/L (ref 10–35)
Albumin: 4.4 g/dL (ref 3.6–5.1)
Alkaline phosphatase (APISO): 55 U/L (ref 37–153)
BUN: 16 mg/dL (ref 7–25)
CO2: 25 mmol/L (ref 20–32)
Calcium: 9.2 mg/dL (ref 8.6–10.4)
Chloride: 106 mmol/L (ref 98–110)
Creat: 0.84 mg/dL (ref 0.50–0.99)
GFR, Est African American: 83 mL/min/{1.73_m2} (ref >=60)
GFR, Est Non African American: 72 mL/min/{1.73_m2} (ref >=60)
Globulin: 2.1 g/dL (calc) (ref 1.9–3.7)
Glucose, Bld: 82 mg/dL (ref 65–99)
Potassium: 4.5 mmol/L (ref 3.5–5.3)
Sodium: 141 mmol/L (ref 135–146)
Total Bilirubin: 0.8 mg/dL (ref 0.2–1.2)
Total Protein: 6.5 g/dL (ref 6.1–8.1)

## 2019-06-30 LAB — CBC WITH DIFFERENTIAL/PLATELET
Absolute Monocytes: 386 cells/uL (ref 200–950)
Basophils Absolute: 38 cells/uL (ref 0–200)
Basophils Relative: 0.9 %
Eosinophils Absolute: 50 cells/uL (ref 15–500)
Eosinophils Relative: 1.2 %
HCT: 37.8 % (ref 35.0–45.0)
Hemoglobin: 12.7 g/dL (ref 11.7–15.5)
Lymphs Abs: 1336 cells/uL (ref 850–3900)
MCH: 32.2 pg (ref 27.0–33.0)
MCHC: 33.6 g/dL (ref 32.0–36.0)
MCV: 95.9 fL (ref 80.0–100.0)
MPV: 10.6 fL (ref 7.5–12.5)
Monocytes Relative: 9.2 %
Neutro Abs: 2390 cells/uL (ref 1500–7800)
Neutrophils Relative %: 56.9 %
Platelets: 266 10*3/uL (ref 140–400)
RBC: 3.94 10*6/uL (ref 3.80–5.10)
RDW: 12.7 % (ref 11.0–15.0)
Total Lymphocyte: 31.8 %
WBC: 4.2 10*3/uL (ref 3.8–10.8)

## 2019-06-30 LAB — TSH: TSH: 1.63 mIU/L (ref 0.40–4.50)

## 2019-07-04 ENCOUNTER — Encounter: Payer: Self-pay | Admitting: Obstetrics & Gynecology

## 2019-07-05 MED FILL — valACYclovir HCL 1 GM TABS: 1 | 90 days supply | Qty: 90 | Fill #2

## 2019-07-05 MED FILL — ESTRADIOL 0.1 MG/GM CRM: 0.1 | 90 days supply | Qty: 43 | Fill #2

## 2019-07-05 MED FILL — METOPROLOL SUCCINATE ER 25: 25 | 30 days supply | Qty: 30 | Fill #3

## 2019-07-09 NOTE — Telephone Encounter (Signed)
Will you do the paperwork for cologuard for Dr. Regis Bill?  Thanks.

## 2019-07-13 ENCOUNTER — Encounter: Payer: Self-pay | Admitting: *Deleted

## 2019-07-13 NOTE — Progress Notes (Unsigned)
Dr. Harrington Challenger requesting update on status of patient's 30 day event monitor.  Will route to make her aware of note below. ________________________________________________________________   Markus Daft A 06/14/19 2:52 PM Note   [employer] to ship a 30 day cardiac event monitor to the patients home.  Instructions reviewed briefly as they are included in the monitor kit.

## 2019-07-19 ENCOUNTER — Telehealth: Payer: Self-pay | Admitting: Internal Medicine

## 2019-07-19 NOTE — Telephone Encounter (Signed)
° ° °  Sarah [name].from [employer] calling to request records. Phone [phone] Fax [phone]

## 2019-07-20 NOTE — Telephone Encounter (Signed)
Medical record request received from [employer]. No visits for patient for dates [employer] is requesting. Notification sent to [facility].

## 2019-08-13 ENCOUNTER — Other Ambulatory Visit: Payer: Self-pay | Admitting: Internal Medicine

## 2019-08-13 DIAGNOSIS — R002 Palpitations: Secondary | ICD-10-CM

## 2019-08-13 DIAGNOSIS — I499 Cardiac arrhythmia, unspecified: Secondary | ICD-10-CM

## 2019-08-13 DIAGNOSIS — R0602 Shortness of breath: Secondary | ICD-10-CM

## 2019-08-17 ENCOUNTER — Telehealth: Payer: Self-pay | Admitting: Internal Medicine

## 2019-08-17 DIAGNOSIS — R9389 Abnormal findings on diagnostic imaging of other specified body structures: Secondary | ICD-10-CM

## 2019-08-17 NOTE — Telephone Encounter (Signed)
Pt had CXR earlier this year   Subtle opacity on R    Recomm repeat PA / Lat CXR to f/u Pt would like to hae done on [employer]

## 2019-08-20 NOTE — Telephone Encounter (Addendum)
CXR ordered.  Message to patient via MyChart to inform.

## 2019-08-20 NOTE — Addendum Note (Signed)
Addended by: Rodman Key on: 08/20/2019 03:18 PM   Modules accepted: Orders

## 2019-08-26 ENCOUNTER — Encounter: Payer: Self-pay | Admitting: Obstetrics & Gynecology

## 2019-08-26 DIAGNOSIS — Z1212 Encounter for screening for malignant neoplasm of rectum: Secondary | ICD-10-CM | POA: Diagnosis not present

## 2019-08-26 DIAGNOSIS — Z1211 Encounter for screening for malignant neoplasm of colon: Secondary | ICD-10-CM | POA: Diagnosis not present

## 2019-08-29 NOTE — Telephone Encounter (Signed)
I would like to petition this case    Please contact me   [phone].    This monitor is justifiable given patients history

## 2019-08-31 LAB — COLOGUARD: Cologuard: NEGATIVE

## 2019-09-04 MED FILL — METOPROLOL SUCCINATE ER 25: 25 | 30 days supply | Qty: 30 | Fill #4

## 2019-09-04 NOTE — Telephone Encounter (Signed)
Forwarding to Lucy Chris in [facility] billing also.

## 2019-10-03 ENCOUNTER — Encounter: Payer: Self-pay | Admitting: Obstetrics & Gynecology

## 2019-10-03 ENCOUNTER — Other Ambulatory Visit: Payer: Self-pay | Admitting: Obstetrics & Gynecology

## 2019-10-03 DIAGNOSIS — Z01419 Encounter for gynecological examination (general) (routine) without abnormal findings: Secondary | ICD-10-CM

## 2019-10-03 MED ORDER — VALACYCLOVIR HCL 1 G PO TABS: 1000.0000 mg | ORAL_TABLET | Freq: Every day | ORAL | 1 refills | Status: DC

## 2019-10-03 MED ORDER — ESTRADIOL 0.1 MG/GM VA CREA: TOPICAL_CREAM | VAGINAL | 0 refills | Status: DC

## 2019-10-03 MED FILL — METOPROLOL SUCCINATE ER 25: 25 | 30 days supply | Qty: 30 | Fill #5

## 2019-10-03 MED FILL — valACYclovir HCL 1 GM TABS: 1 | 30 days supply | Qty: 30 | Fill #0

## 2019-10-03 MED FILL — ESTRADIOL 0.1 MG/GM CRM: 0.1 | 90 days supply | Qty: 43 | Fill #0

## 2019-10-03 NOTE — Telephone Encounter (Signed)
Patient sent the following message through [facility]. Routing to triage to assist patient with request.  Rauf Md, [place] Pool  Phone Number: [phone]  can you send in refills for valcyclovir and estradiol cream to [facility]? the Rx has expired and annual visit is not until April.  Thanks  Happy new Year.  WP

## 2019-10-03 NOTE — Telephone Encounter (Signed)
Med refill request: Estrace cream and Valtrex Last AEX: 08/30/2018 Next AEX: 04/082021 Last MMG (if hormonal med) 07/27/2018, BIRADS 2, benign.  Refill authorized: Estrace cream- 42.5g/ 0RF   Valtrex- #90, 1 RF both to get to AEX. Orders pended if approved.

## 2019-11-07 MED FILL — valACYclovir HCL 1 GM TABS: 1 | 90 days supply | Qty: 90 | Fill #1

## 2019-11-07 MED FILL — METOPROLOL SUCCINATE ER 25: 25 | 30 days supply | Qty: 30 | Fill #6

## 2019-12-12 ENCOUNTER — Other Ambulatory Visit: Payer: Self-pay

## 2019-12-12 MED ORDER — METOPROLOL SUCCINATE ER 25 MG PO TB24: 25.0000 mg | ORAL_TABLET | Freq: Every day | ORAL | 0 refills | Status: DC

## 2019-12-12 MED FILL — METOPROLOL SUCCINATE ER 25: 25 | 90 days supply | Qty: 90 | Fill #0

## 2019-12-13 DIAGNOSIS — Z1231 Encounter for screening mammogram for malignant neoplasm of breast: Secondary | ICD-10-CM | POA: Diagnosis not present

## 2019-12-13 MED ORDER — METOPROLOL SUCCINATE ER 25 MG PO TB24: 25.0000 mg | ORAL_TABLET | Freq: Every day | ORAL | 3 refills | Status: DC

## 2019-12-13 NOTE — Telephone Encounter (Signed)
Refilled Toprol XL

## 2019-12-19 NOTE — Telephone Encounter (Signed)
This is in process of appeal by [employer], as it is their claim.  My manager will check the status when she returns to the office.  Thank you.

## 2019-12-21 NOTE — Telephone Encounter (Signed)
Good Afternoon,  Contact has been completed with [employer] at phone number [phone]. Representative Duwayne Heck has advised that all needed information was received from the cardiologist office and an appeal was submitted. The are currently still awaiting a decision from the insurance plan.   I have requested patient be contacted by [employer] once a decision has been confirmed.   Thank you, Bertram Gala

## 2020-01-08 NOTE — Progress Notes (Signed)
69 y.o. [id number] Married White or Caucasian female here for annual exam.  Doing well.  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    Denies vaginal bleeding.    Patient's last menstrual period was 10/05/2003.          Sexually active: Yes.    The current method of family planning is post menopausal status & BTL.    Exercising: No.  exercise Smoker:  no  Health Maintenance: Pap:  05-14-16 neg HPV HR neg History of abnormal Pap:  yes MMG:  12-13-2019 category a density birads 1:neg Colonoscopy:  01/2009 f/u 11yrs, cologuard neg 2020.  Aware to repeat 3 years.  BMD:   08-10-18 osteopenia TDaP:  2019 Pneumonia vaccine(s): not done.  D/w pt today. Shingrix: Not done.  D/w pt today. Hep C testing: not done Screening Labs: no   reports that she has never smoked. She has never used smokeless tobacco. She reports current alcohol use of about 6.0 standard drinks of alcohol per week. She reports that she does not use drugs.  Past Medical History:  Diagnosis Date  . History of abnormal Pap smear    CIN III, cervical cone 1984  . History of subacute thyroiditis    resolved by itself  . HSV-2 infection     Past Surgical History:  Procedure Laterality Date  . CERVICAL CONIZATION W/BX  1984   CIN III  . CESAREAN SECTION  [phone]  . SALPINGECTOMY Right    ectopic pregnancy  . TUBAL LIGATION Bilateral 1988   reversal in 1991  . WRIST SURGERY Left     Current Outpatient Medications  Medication Sig Dispense Refill  . estradiol (ESTRACE) 0.1 MG/GM vaginal cream 1 gram pv twice weekly 42.5 g 0  . metoprolol succinate (TOPROL XL) 25 MG 24 hr tablet Take 1 tablet (25 mg total) by mouth daily. 90 tablet 3  . valACYclovir (VALTREX) 1000 MG tablet Take 1 tablet (1,000 mg total) by mouth daily. 90 tablet 1   No current facility-administered medications for this visit.    Family History  Problem Relation Age of Onset  . Lung cancer Mother   . Hypertension Mother   . Thyroid  nodules Mother   . Stroke Father        embolic, complications from a-fib    Review of Systems  Constitutional: Negative.   HENT: Negative.   Eyes: Negative.   Respiratory: Negative.   Cardiovascular: Negative.   Gastrointestinal: Negative.   Endocrine: Negative.   Genitourinary: Negative.   Musculoskeletal: Negative.   Skin: Negative.   Allergic/Immunologic: Negative.   Neurological: Negative.   Psychiatric/Behavioral: Negative.     Exam:   Vitals:   01/10/20 0852  BP: 120/82  Pulse: 68  Resp: 16  Temp: (!) 97.3 F (36.3 C)    General appearance: alert, cooperative and appears stated age Head: Normocephalic, without obvious abnormality, atraumatic Neck: no adenopathy, supple, symmetrical, trachea midline and thyroid normal to inspection and palpation Lungs: clear to auscultation bilaterally Breasts: normal appearance, no masses or tenderness Heart: regular rate and rhythm Abdomen: soft, non-tender; bowel sounds normal; no masses,  no organomegaly Extremities: extremities normal, atraumatic, no cyanosis or edema Skin: Skin color, texture, turgor normal. No rashes or lesions Lymph nodes: Cervical, supraclavicular, and axillary nodes normal. No abnormal inguinal nodes palpated Neurologic: Grossly normal   Pelvic: External genitalia:  no lesions              Urethra:  normal appearing  urethra with no masses, tenderness or lesions              Bartholins and Skenes: normal                 Vagina: normal appearing vagina with normal color and discharge, no lesions              Cervix: no lesions              Pap taken: No. Bimanual Exam:  Uterus:  normal size, contour, position, consistency, mobility, non-tender              Adnexa: normal adnexa and no mass, fullness, tenderness               Rectovaginal: Confirms               Anus:  normal sphincter tone, no lesions  Chaperone, Zenovia Jordan, CMA, was present for exam.  A:  Well Woman with normal exam PMP,  no HRT H/o HSV 2  P:   Mammogram guidelines reviewed pap smear with neg HR HPV 2017, guidelines reviewed.  H/o CIN 3 in 1984.  Pt and I agreed to repeat in 5 years Lab work done 06/2019.  She is aware to continue follow creatinine level RF for Valtrex 1gm daily.  #90/4rf RF for estrace vaginal cream to pharmacy She is going to start the pneumonia vaccination this year.  Shingrix reviewed as well. Return annually or prn

## 2020-01-10 ENCOUNTER — Other Ambulatory Visit: Payer: Self-pay | Admitting: Obstetrics & Gynecology

## 2020-01-10 ENCOUNTER — Ambulatory Visit (INDEPENDENT_AMBULATORY_CARE_PROVIDER_SITE_OTHER): Payer: 59 | Admitting: Obstetrics & Gynecology

## 2020-01-10 ENCOUNTER — Other Ambulatory Visit: Payer: Self-pay

## 2020-01-10 ENCOUNTER — Encounter: Payer: Self-pay | Admitting: Obstetrics & Gynecology

## 2020-01-10 VITALS — BP 120/82 | HR 68 | Temp 97.3°F | Resp 16 | Ht 64.5 in | Wt 151.0 lb

## 2020-01-10 DIAGNOSIS — Z01419 Encounter for gynecological examination (general) (routine) without abnormal findings: Secondary | ICD-10-CM

## 2020-01-10 MED ORDER — ESTRADIOL 0.1 MG/GM VA CREA: TOPICAL_CREAM | VAGINAL | 3 refills | Status: DC

## 2020-01-10 MED ORDER — VALACYCLOVIR HCL 1 G PO TABS: 1000.0000 mg | ORAL_TABLET | Freq: Every day | ORAL | 3 refills | Status: DC

## 2020-02-14 DIAGNOSIS — H40013 Open angle with borderline findings, low risk, bilateral: Secondary | ICD-10-CM | POA: Diagnosis not present

## 2020-02-14 DIAGNOSIS — H2513 Age-related nuclear cataract, bilateral: Secondary | ICD-10-CM | POA: Diagnosis not present

## 2020-03-19 MED FILL — valACYclovir HCL 1 GM TABS: 1 | 90 days supply | Qty: 90 | Fill #0

## 2020-03-19 MED FILL — METOPROLOL SUCCINATE ER 25: 25 | 90 days supply | Qty: 90 | Fill #0

## 2020-05-06 ENCOUNTER — Encounter: Payer: Self-pay | Admitting: Obstetrics & Gynecology

## 2020-06-18 MED FILL — ESTRADIOL 0.1 MG/GM CREA: 0.1 | 90 days supply | Qty: 43 | Fill #0

## 2020-06-18 MED FILL — valACYclovir HCL 1 GM TABS: 1 | 90 days supply | Qty: 90 | Fill #1

## 2020-06-18 MED FILL — METOPROLOL SUCCINATE ER 25: 25 | 90 days supply | Qty: 90 | Fill #1

## 2020-07-25 IMAGING — MR MR CARD MORPHOLOGY WO/W CM
26 of 28 series · 38 of 40 positions shown · IV contrast (Gadavist)
Comparison: none

CLINICAL DATA: LVOT outflow tract obstruction on TTE

EXAM:
CARDIAC MRI
TECHNIQUE: The patient was scanned on a 1.5 Tesla Siemens magnet. A dedicated
cardiac coil was used. Functional imaging was done using Fiesta
sequences. [DATE], and 4 chamber views were done to assess for RWMA's.
Modified [place]le rule using a short axis stack was used to
calculate an ejection fraction on a dedicated work station using
Circle software. The patient received 8 cc of Gadavist. After 10
minutes inversion recovery sequences were used to assess for
infiltration and scar tissue.
CONTRAST:  8 cc  of Gadavist

[Series 6: bSSFP · oblique · 8.0mm · 1.43mm/px · 8 of 350 slices shown (1 of 4)]
[im 1/350]
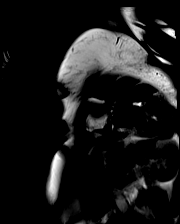
[im 50/350]
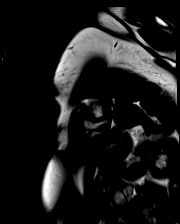
[im 100/350]
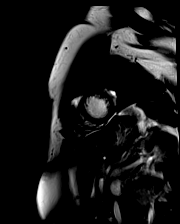
[im 150/350]
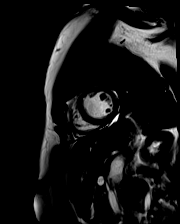
[im 200/350]
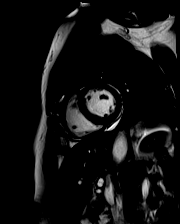
[im 250/350]
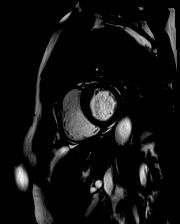
[im 300/350]
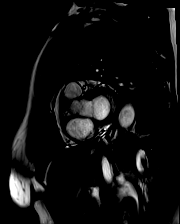
[im 350/350]
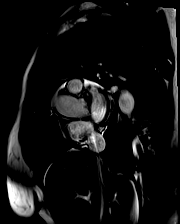

[Series 7: t2_stir_db_sax · oblique · 8.0mm · 1.54mm/px · 1 of 12 slices shown]
[im 1/12]
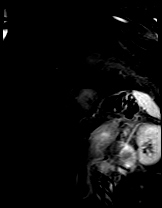

[Series 8: cine horizontal axis · oblique · 6.0mm · 1.73mm/px · 2 of 144 slices shown]
[im 1/144]
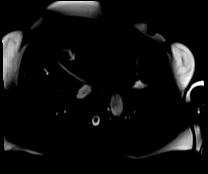
[im 144/144]
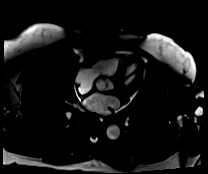

[Series 9: t2_stir_db_radial ((date)ch) · oblique · 6.0mm · 1.73mm/px · 1 of 3 slices shown (1 of 2)]
[im 1/3]
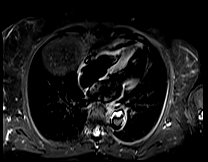

[Series 10: (id)_long_t1 · oblique · 8.0mm · 1.25mm/px · 1 of 24 slices shown (1 of 2)]
[im 1/24]
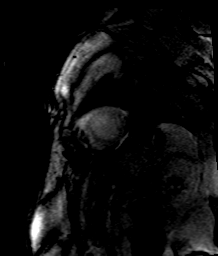

[Series 11: (id)_long_t1_moco · oblique · 8.0mm · 1.25mm/px · 1 of 24 slices shown (1 of 2)]
[im 1/24]
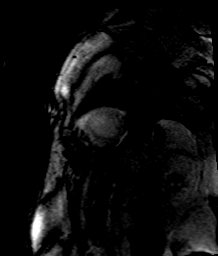

[Series 14: t2_stir_db_radial ((date)ch) · oblique · 6.0mm · 1.73mm/px · 1 of 3 slices shown (2 of 2)]
[im 1/3]
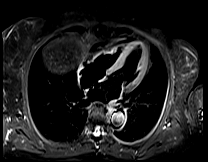

[Series 15: bSSFP · oblique · 7.0mm · 1.41mm/px · 1 of 25 slices shown (2 of 4)]
[im 1/25]
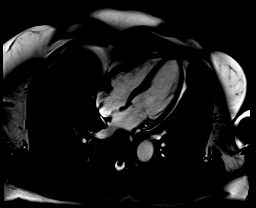

[Series 16: bSSFP · oblique · 7.0mm · 1.41mm/px · 1 of 25 slices shown (3 of 4)]
[im 1/25]
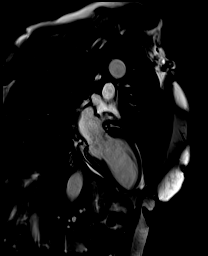

[Series 17: bSSFP · oblique · 7.0mm · 1.41mm/px · 1 of 25 slices shown (4 of 4)]
[im 1/25]
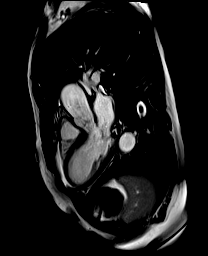

[Series 19: lge_single shot sa · oblique · 8.0mm · 1.98mm/px · 1 of 14 slices shown (1 of 2)]
[im 1/14]
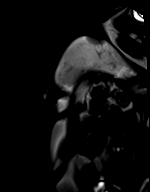

[Series 20: lge_single shot sa · oblique · 8.0mm · 1.98mm/px · 1 of 14 slices shown (2 of 2)]
[im 1/14]
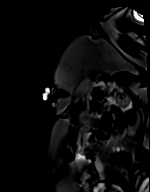

[Series 27: (id)_long_t1 · oblique · 8.0mm · 1.25mm/px · 1 of 24 slices shown (2 of 2)]
[im 1/24]
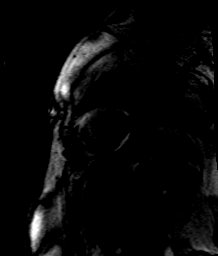

[Series 28: (id)_long_t1_moco · oblique · 8.0mm · 1.25mm/px · 1 of 24 slices shown (2 of 2)]
[im 1/24]
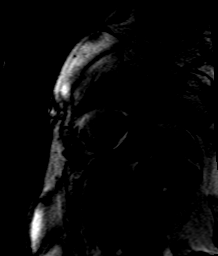

[Series 32: lge short axis_mag · oblique · 8.0mm · 1.43mm/px · 1 of 12 slices shown]
[im 1/12]
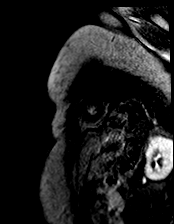

[Series 33: lge short axis_psir · oblique · 8.0mm · 1.43mm/px · 1 of 12 slices shown]
[im 1/12]
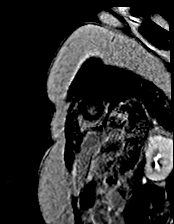

[Series 40: cine aov · oblique · 6.0mm · 1.43mm/px · 5 of 225 slices shown]
[im 1/225]
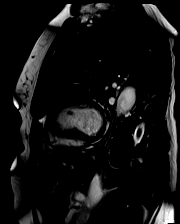
[im 57/225]
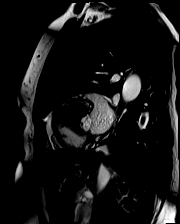
[im 113/225]
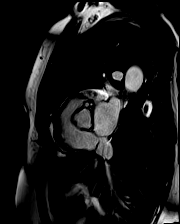
[im 169/225]
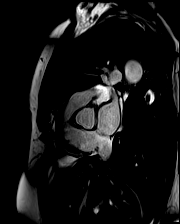
[im 225/225]
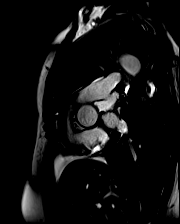

[Series 41: pc through lvo · oblique · 6.0mm · 1.73mm/px · 1 of 30 slices shown]
[im 1/30]
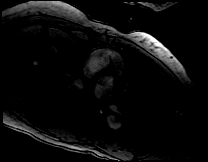

[Series 42: pc through lvo_mag · oblique · 6.0mm · 1.73mm/px · 1 of 29 slices shown]
[im 1/29]
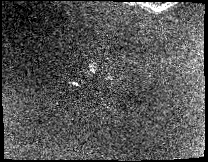

[Series 43: pc through lvo_p · oblique · 6.0mm · 1.73mm/px · 1 of 30 slices shown]
[im 1/30]
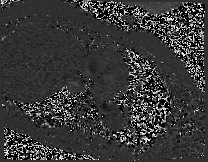

[Series 44: pc through aortic · oblique · 6.0mm · 1.73mm/px · 1 of 30 slices shown (1 of 6)]
[im 1/30]
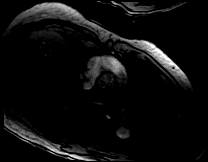

[Series 45: pc through aortic · oblique · 6.0mm · 1.73mm/px · 1 of 30 slices shown (2 of 6)]
[im 1/30]
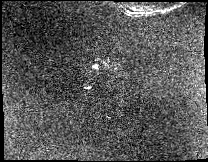

[Series 46: pc through aortic · oblique · 6.0mm · 1.73mm/px · 1 of 30 slices shown (3 of 6)]
[im 1/30]
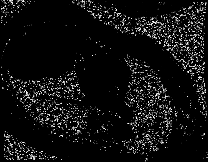

[Series 47: pc through aortic · oblique · 6.0mm · 1.73mm/px · 1 of 30 slices shown (4 of 6)]
[im 1/30]
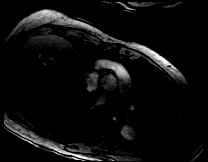

[Series 48: pc through aortic · oblique · 6.0mm · 1.73mm/px · 1 of 29 slices shown (5 of 6)]
[im 1/29]
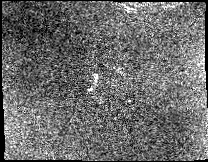

[Series 49: pc through aortic · oblique · 6.0mm · 1.73mm/px · 1 of 30 slices shown (6 of 6)]
[im 1/30]
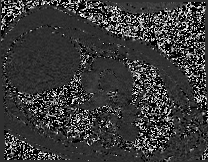

[38 of 40 positions shown; findings below may reference images not displayed]

FINDINGS: Left ventricle:

- Asymmetric hypertrophy measuring up to 15mm in the basal
anteroseptum (7mm in lateral wall).

- Peak resting LVOT gradient 8 mmHg

- Normal size

- Hyperdynamic systolic function

- Normal ECV (24%)

- Patchy LGE in the basal anteroseptal/anterior wall

LV EF:  75% (Normal 56-78%)

Absolute volumes:

LV EDV: 102mL (Normal 52-141 mL)

LV ESV: 26mL (Normal 13-51 mL)

LV SV: 77mL (Normal 33-97 mL)

CO: 4.9L/min (Normal 2.7-6.0 L/min)

Indexed volumes:

LV EDV: 58mL/sq-m (Normal 41-81 mL/sq-m)

LV ESV: 14mL/sq-m (Normal 12-21 mL/sq-m)

LV SV: 43mL/sq-m (Normal 26-56 mL/sq-m)

CI: 3L/min/sq-m (Normal 1.8-3.8 L/min/sq-m)

Right ventricle: Normal size and systolic function

RV EF: 79% (Normal 47-80%)

Absolute volumes:

RV EDV: 92mL (Normal 58-154 mL)

RV ESV: 20mL (Normal 12-68 mL)

RV SV: 72mL (Normal 35-98 mL)

CO: 4.7L/min (Normal 2.7-6 L/min)

Indexed volumes:

RV EDV: 52mL/sq-m (Normal 48-87 mL/sq-m)

RV ESV: 11mL/sq-m (Normal 11-28 mL/sq-m)

RV SV: 41mL/sq-m (Normal 27-57 mL/sq-m)

CI: 3L/min/sq-m (Normal 1.8-3.8 L/min/sq-m)

Left atrium: Mild enlargement

Right atrium: Normal size

Mitral valve: No regurgitation

Aortic valve: No regurgitation

Tricuspid valve: Mild regurgitation

Pericardium: Small effusion
IMPRESSION: 1. Asymmetric hypertrophy measuring up to 15mm in the basal
anteroseptum (7mm in lateral wall), consistent with hypertrophic
cardiomyopathy

2. Patchy late gadolinium enhancement in the basal anteroseptal
wall, consistent with hypertrophic cardiomyopathy

3.  Peak resting LVOT gradient measured at 8 mmHg

4.  Normal LV size with hyperdynamic systolic function (EF 75%)

5.  Normal RV size and function

6.  Small pericardial effusion

## 2020-08-13 DIAGNOSIS — H0102B Squamous blepharitis left eye, upper and lower eyelids: Secondary | ICD-10-CM | POA: Diagnosis not present

## 2020-08-13 DIAGNOSIS — H40013 Open angle with borderline findings, low risk, bilateral: Secondary | ICD-10-CM | POA: Diagnosis not present

## 2020-08-13 DIAGNOSIS — H0102A Squamous blepharitis right eye, upper and lower eyelids: Secondary | ICD-10-CM | POA: Diagnosis not present

## 2020-08-13 DIAGNOSIS — H2513 Age-related nuclear cataract, bilateral: Secondary | ICD-10-CM | POA: Diagnosis not present

## 2020-09-23 MED FILL — valACYclovir HCL 1 GM TABS: 1 | 90 days supply | Qty: 90 | Fill #2

## 2020-09-23 MED FILL — ESTRADIOL 0.1 MG/GM CREA: 0.1 | 90 days supply | Qty: 43 | Fill #1

## 2020-09-23 MED FILL — METOPROLOL SUCCINATE ER 25: 25 | 90 days supply | Qty: 90 | Fill #2

## 2020-11-12 DIAGNOSIS — H40013 Open angle with borderline findings, low risk, bilateral: Secondary | ICD-10-CM | POA: Diagnosis not present

## 2020-11-12 DIAGNOSIS — H0102B Squamous blepharitis left eye, upper and lower eyelids: Secondary | ICD-10-CM | POA: Diagnosis not present

## 2020-11-12 DIAGNOSIS — H0102A Squamous blepharitis right eye, upper and lower eyelids: Secondary | ICD-10-CM | POA: Diagnosis not present

## 2020-11-12 DIAGNOSIS — H2513 Age-related nuclear cataract, bilateral: Secondary | ICD-10-CM | POA: Diagnosis not present

## 2020-12-17 ENCOUNTER — Telehealth: Payer: Self-pay | Admitting: Internal Medicine

## 2020-12-17 ENCOUNTER — Other Ambulatory Visit: Payer: Self-pay | Admitting: Internal Medicine

## 2020-12-17 MED ORDER — METOPROLOL SUCCINATE ER 25 MG PO TB24: 25.0000 mg | ORAL_TABLET | Freq: Every day | ORAL | 3 refills | Status: DC

## 2020-12-17 NOTE — Telephone Encounter (Signed)
Metoprolol refilled to [facility] pharmacy.

## 2020-12-17 NOTE — Telephone Encounter (Signed)
Please call in refill for metoprolol to [facility] pharmacy    For the most part BP is good  Pt feelng overall good

## 2020-12-17 NOTE — Addendum Note (Signed)
Addended by: Lendon Ka on: 12/17/2020 05:09 PM   Modules accepted: Orders

## 2020-12-18 ENCOUNTER — Encounter: Payer: Self-pay | Admitting: Obstetrics & Gynecology

## 2020-12-18 DIAGNOSIS — Z1231 Encounter for screening mammogram for malignant neoplasm of breast: Secondary | ICD-10-CM | POA: Diagnosis not present

## 2020-12-29 ENCOUNTER — Other Ambulatory Visit (HOSPITAL_COMMUNITY): Payer: Self-pay | Admitting: *Deleted

## 2021-01-02 ENCOUNTER — Other Ambulatory Visit: Payer: Self-pay

## 2021-01-02 ENCOUNTER — Ambulatory Visit (HOSPITAL_BASED_OUTPATIENT_CLINIC_OR_DEPARTMENT_OTHER)
Admission: RE | Admit: 2021-01-02 | Discharge: 2021-01-02 | Disposition: A | Discharge status: Home / Self Care | Payer: 59 | Source: Ambulatory Visit | Attending: Cardiology | Admitting: Cardiology

## 2021-01-23 ENCOUNTER — Other Ambulatory Visit: Payer: Self-pay

## 2021-01-23 ENCOUNTER — Ambulatory Visit (INDEPENDENT_AMBULATORY_CARE_PROVIDER_SITE_OTHER): Payer: 59 | Admitting: Obstetrics & Gynecology

## 2021-01-23 ENCOUNTER — Ambulatory Visit: Payer: 59

## 2021-01-23 ENCOUNTER — Encounter (HOSPITAL_BASED_OUTPATIENT_CLINIC_OR_DEPARTMENT_OTHER): Payer: Self-pay

## 2021-01-23 ENCOUNTER — Encounter (HOSPITAL_BASED_OUTPATIENT_CLINIC_OR_DEPARTMENT_OTHER): Payer: Self-pay | Admitting: Obstetrics & Gynecology

## 2021-01-23 VITALS — BP 130/88 | HR 57 | Ht 63.25 in | Wt 155.6 lb

## 2021-01-23 DIAGNOSIS — R918 Other nonspecific abnormal finding of lung field: Secondary | ICD-10-CM | POA: Diagnosis not present

## 2021-01-23 DIAGNOSIS — D069 Carcinoma in situ of cervix, unspecified: Secondary | ICD-10-CM | POA: Diagnosis not present

## 2021-01-23 DIAGNOSIS — Z780 Asymptomatic menopausal state: Secondary | ICD-10-CM | POA: Diagnosis not present

## 2021-01-23 DIAGNOSIS — Z01419 Encounter for gynecological examination (general) (routine) without abnormal findings: Secondary | ICD-10-CM

## 2021-01-23 DIAGNOSIS — B009 Herpesviral infection, unspecified: Secondary | ICD-10-CM | POA: Diagnosis not present

## 2021-01-23 DIAGNOSIS — M8580 Other specified disorders of bone density and structure, unspecified site: Secondary | ICD-10-CM | POA: Diagnosis not present

## 2021-01-23 NOTE — Progress Notes (Signed)
70 y.o. [id number] Married White or Caucasian female here for annual exam.    Had lab work with [facility] and cardiac calcium score.  This was 0 for her.    Had cxray with vague opacification.  Did not go for repeat cxray.  Willing to go now.  Order will be placed.    Denies vaginal bleeding.  Patient's last menstrual period was 10/05/2003.          Sexually active: Yes.    The current method of family planning is post menopausal status.    Smoker:  no  Health Maintenance: Pap:  guidelines reviewed.  Decided to stop together History of abnormal Pap:  Yes, CIN 3 in 1984 MMG:  05/2020 Colonoscopy:  cologuard 2020 BMD:   08/2018, -2.0 TDaP: 08/30/2018 Pneumonia vaccine(s):  Not done yet Shingrix:  no Hep C testing: has not done Screening Labs: doctors day   reports that she has never smoked. She has never used smokeless tobacco. She reports current alcohol use of about 6.0 standard drinks of alcohol per week. She reports that she does not use drugs.  Past Medical History:  Diagnosis Date  . History of abnormal Pap smear    CIN III, cervical cone 1984  . History of subacute thyroiditis    resolved by itself  . HSV-2 infection   . Osteopenia     Past Surgical History:  Procedure Laterality Date  . CERVICAL CONIZATION W/BX  1984   CIN III  . CESAREAN SECTION  [phone]  . SALPINGECTOMY Right    ectopic pregnancy  . TUBAL LIGATION Bilateral 1988   reversal in 1991  . WRIST SURGERY Left     Current Outpatient Medications  Medication Sig Dispense Refill  . metoprolol succinate (TOPROL-XL) 25 MG 24 hr tablet TAKE 1 TABLET BY MOUTH ONCE DAILY 90 tablet 3   No current facility-administered medications for this visit.    Family History  Problem Relation Age of Onset  . Lung cancer Mother   . Hypertension Mother   . Thyroid nodules Mother   . Stroke Father        embolic, complications from a-fib    Review of Systems  Exam:   BP 130/88   Pulse (!) 57   Ht 5'  3.25" (1.607 m)   Wt 155 lb 9.6 oz (70.6 kg)   LMP 10/05/2003   BMI 27.35 kg/m   Height: 5' 3.25" (160.7 cm)  General appearance: alert, cooperative and appears stated age Head: Normocephalic, without obvious abnormality, atraumatic Neck: no adenopathy, supple, symmetrical, trachea midline and thyroid normal to inspection and palpation Lungs: clear to auscultation bilaterally Breasts: normal appearance, no masses or tenderness Heart: regular rate and rhythm Abdomen: soft, non-tender; bowel sounds normal; no masses,  no organomegaly Extremities: extremities normal, atraumatic, no cyanosis or edema Skin: Skin color, texture, turgor normal. No rashes or lesions Lymph nodes: Cervical, supraclavicular, and axillary nodes normal. No abnormal inguinal nodes palpated Neurologic: Grossly normal   Pelvic: External genitalia:  no lesions              Urethra:  normal appearing urethra with no masses, tenderness or lesions              Bartholins and Skenes: normal                 Vagina: normal appearing vagina with normal color and discharge, no lesions  Cervix: no lesions              Pap taken: No. Bimanual Exam:  Uterus:  normal size, contour, position, consistency, mobility, non-tender              Adnexa: normal adnexa               Rectovaginal: Confirms               Anus:  normal sphincter tone, no lesions  Chaperone, Carolee Rota, RN, was present for exam.  Assessment/Plan: 1. Well woman exam with routine gynecological exam - pap guidelines reviewed.  Does not need additional screening. She is comfortable with this. - MMG 05/2020 - colonoscopy 2020 - BMD 11/19 - vaccines updated.  She will get me copy of covid vaccination dates. - lab work done with [facility].  I do not have copies of this.  2. Postmenopausal - no HRT.  Not using estrace cream.  3. Osteopenia, unspecified location  4. Opacity of lung on imaging study - DG Chest 2 View; Future  5. HSV-2  infection  6. High grade squamous intraepithelial lesion (HGSIL), grade 3 CIN, on biopsy of cervix - 1984.  Follow up has been completely negative.  7.  H/o HSV 2 - has not used valtrex so no rx given today.  If needs, she is welcome to call.

## 2021-01-24 DIAGNOSIS — M8580 Other specified disorders of bone density and structure, unspecified site: Secondary | ICD-10-CM | POA: Insufficient documentation

## 2021-01-24 DIAGNOSIS — Z780 Asymptomatic menopausal state: Secondary | ICD-10-CM | POA: Insufficient documentation

## 2021-01-24 DIAGNOSIS — B009 Herpesviral infection, unspecified: Secondary | ICD-10-CM | POA: Insufficient documentation

## 2021-01-30 ENCOUNTER — Encounter (HOSPITAL_BASED_OUTPATIENT_CLINIC_OR_DEPARTMENT_OTHER): Payer: Self-pay

## 2021-02-05 NOTE — Telephone Encounter (Signed)
Please advise 

## 2021-02-10 ENCOUNTER — Ambulatory Visit: Payer: 59 | Attending: Internal Medicine

## 2021-02-10 DIAGNOSIS — Z20822 Contact with and (suspected) exposure to covid-19: Secondary | ICD-10-CM | POA: Diagnosis not present

## 2021-02-12 LAB — NOVEL CORONAVIRUS, NAA

## 2021-03-06 ENCOUNTER — Other Ambulatory Visit (HOSPITAL_BASED_OUTPATIENT_CLINIC_OR_DEPARTMENT_OTHER): Payer: Self-pay

## 2021-03-06 MED ORDER — CARESTART COVID-19 HOME TEST VI KIT
PACK | 0 refills | Status: DC
  Filled 2021-03-06: qty 4, 8d supply, fill #0

## 2021-03-10 NOTE — Telephone Encounter (Signed)
Do we have any updates for this patient regarding the below message? (facility fees)

## 2021-03-18 ENCOUNTER — Other Ambulatory Visit (HOSPITAL_BASED_OUTPATIENT_CLINIC_OR_DEPARTMENT_OTHER): Payer: Self-pay

## 2021-03-18 MED FILL — Metoprolol Succinate Tab ER 24HR 25 MG (Tartrate Equiv): ORAL | 90 days supply | Qty: 90 | Fill #0 | Status: AC

## 2021-04-29 ENCOUNTER — Other Ambulatory Visit (HOSPITAL_BASED_OUTPATIENT_CLINIC_OR_DEPARTMENT_OTHER): Payer: Self-pay | Admitting: Obstetrics & Gynecology

## 2021-04-29 ENCOUNTER — Encounter (HOSPITAL_BASED_OUTPATIENT_CLINIC_OR_DEPARTMENT_OTHER): Payer: Self-pay

## 2021-04-29 DIAGNOSIS — R945 Abnormal results of liver function studies: Secondary | ICD-10-CM

## 2021-04-29 DIAGNOSIS — Z1159 Encounter for screening for other viral diseases: Secondary | ICD-10-CM

## 2021-04-29 DIAGNOSIS — Z0000 Encounter for general adult medical examination without abnormal findings: Secondary | ICD-10-CM

## 2021-04-29 DIAGNOSIS — R7989 Other specified abnormal findings of blood chemistry: Secondary | ICD-10-CM

## 2021-04-30 ENCOUNTER — Other Ambulatory Visit: Payer: Self-pay

## 2021-04-30 ENCOUNTER — Ambulatory Visit (HOSPITAL_BASED_OUTPATIENT_CLINIC_OR_DEPARTMENT_OTHER)
Admission: RE | Admit: 2021-04-30 | Discharge: 2021-04-30 | Disposition: A | Discharge status: Home / Self Care | Payer: 59 | Source: Ambulatory Visit | Attending: Obstetrics & Gynecology | Admitting: Obstetrics & Gynecology

## 2021-04-30 ENCOUNTER — Ambulatory Visit (HOSPITAL_BASED_OUTPATIENT_CLINIC_OR_DEPARTMENT_OTHER): Payer: 59

## 2021-04-30 DIAGNOSIS — R945 Abnormal results of liver function studies: Secondary | ICD-10-CM

## 2021-04-30 DIAGNOSIS — Z0000 Encounter for general adult medical examination without abnormal findings: Secondary | ICD-10-CM | POA: Diagnosis not present

## 2021-04-30 DIAGNOSIS — R9389 Abnormal findings on diagnostic imaging of other specified body structures: Secondary | ICD-10-CM | POA: Diagnosis not present

## 2021-04-30 DIAGNOSIS — R918 Other nonspecific abnormal finding of lung field: Secondary | ICD-10-CM | POA: Diagnosis not present

## 2021-04-30 DIAGNOSIS — Z1159 Encounter for screening for other viral diseases: Secondary | ICD-10-CM | POA: Diagnosis not present

## 2021-04-30 DIAGNOSIS — R7989 Other specified abnormal findings of blood chemistry: Secondary | ICD-10-CM

## 2021-05-01 LAB — HEPATIC FUNCTION PANEL
ALT: 26 IU/L (ref 0–32)
AST: 31 IU/L (ref 0–40)
Albumin: 4.5 g/dL (ref 3.8–4.8)
Alkaline Phosphatase: 61 IU/L (ref 44–121)
Bilirubin Total: 0.6 mg/dL (ref 0.0–1.2)
Bilirubin, Direct: 0.17 mg/dL (ref 0.00–0.40)
Total Protein: 6.5 g/dL (ref 6.0–8.5)

## 2021-05-01 LAB — HEPATITIS C ANTIBODY: Hep C Virus Ab: 0.2 s/co ratio (ref 0.0–0.9)

## 2021-05-01 LAB — TSH: TSH: 2.76 u[IU]/mL (ref 0.450–4.500)

## 2021-05-01 LAB — T4, FREE: Free T4: 0.86 ng/dL (ref 0.82–1.77)

## 2021-06-12 ENCOUNTER — Other Ambulatory Visit (HOSPITAL_BASED_OUTPATIENT_CLINIC_OR_DEPARTMENT_OTHER): Payer: Self-pay | Admitting: *Deleted

## 2021-06-12 ENCOUNTER — Other Ambulatory Visit (HOSPITAL_BASED_OUTPATIENT_CLINIC_OR_DEPARTMENT_OTHER): Payer: Self-pay

## 2021-06-12 ENCOUNTER — Encounter (HOSPITAL_BASED_OUTPATIENT_CLINIC_OR_DEPARTMENT_OTHER): Payer: Self-pay

## 2021-06-12 DIAGNOSIS — Z01419 Encounter for gynecological examination (general) (routine) without abnormal findings: Secondary | ICD-10-CM

## 2021-06-12 DIAGNOSIS — Z780 Asymptomatic menopausal state: Secondary | ICD-10-CM

## 2021-06-12 DIAGNOSIS — B009 Herpesviral infection, unspecified: Secondary | ICD-10-CM

## 2021-06-12 MED ORDER — ESTRADIOL 0.1 MG/GM VA CREA
TOPICAL_CREAM | VAGINAL | 3 refills | Status: DC
  Filled 2021-06-12: qty 42.5, 90d supply, fill #0

## 2021-06-12 MED ORDER — VALACYCLOVIR HCL 1 G PO TABS
ORAL_TABLET | Freq: Every day | ORAL | 3 refills | Status: DC
  Filled 2021-06-12: qty 90, 90d supply, fill #0
  Filled 2021-09-02: qty 90, 90d supply, fill #1
  Filled 2021-12-09: qty 90, 90d supply, fill #2

## 2021-06-12 MED FILL — Metoprolol Succinate Tab ER 24HR 25 MG (Tartrate Equiv): ORAL | 90 days supply | Qty: 90 | Fill #1 | Status: AC

## 2021-08-07 DIAGNOSIS — H40013 Open angle with borderline findings, low risk, bilateral: Secondary | ICD-10-CM | POA: Diagnosis not present

## 2021-08-07 DIAGNOSIS — H2513 Age-related nuclear cataract, bilateral: Secondary | ICD-10-CM | POA: Diagnosis not present

## 2021-08-07 DIAGNOSIS — H0102B Squamous blepharitis left eye, upper and lower eyelids: Secondary | ICD-10-CM | POA: Diagnosis not present

## 2021-08-07 DIAGNOSIS — H0102A Squamous blepharitis right eye, upper and lower eyelids: Secondary | ICD-10-CM | POA: Diagnosis not present

## 2021-09-02 ENCOUNTER — Other Ambulatory Visit (HOSPITAL_BASED_OUTPATIENT_CLINIC_OR_DEPARTMENT_OTHER): Payer: Self-pay

## 2021-09-02 MED FILL — Metoprolol Succinate Tab ER 24HR 25 MG (Tartrate Equiv): ORAL | 90 days supply | Qty: 90 | Fill #2 | Status: AC

## 2021-12-09 ENCOUNTER — Encounter: Payer: Self-pay | Admitting: Internal Medicine

## 2021-12-09 ENCOUNTER — Other Ambulatory Visit (HOSPITAL_COMMUNITY): Payer: Self-pay

## 2021-12-09 ENCOUNTER — Other Ambulatory Visit (HOSPITAL_BASED_OUTPATIENT_CLINIC_OR_DEPARTMENT_OTHER): Payer: Self-pay

## 2021-12-09 ENCOUNTER — Other Ambulatory Visit: Payer: Self-pay | Admitting: Internal Medicine

## 2021-12-09 MED ORDER — METOPROLOL SUCCINATE ER 25 MG PO TB24
ORAL_TABLET | Freq: Every day | ORAL | 3 refills | Status: DC
  Filled 2021-12-09: qty 90, 90d supply, fill #0

## 2021-12-10 ENCOUNTER — Other Ambulatory Visit (HOSPITAL_BASED_OUTPATIENT_CLINIC_OR_DEPARTMENT_OTHER): Payer: Self-pay

## 2021-12-10 ENCOUNTER — Other Ambulatory Visit: Payer: Self-pay

## 2021-12-10 MED ORDER — METOPROLOL SUCCINATE ER 25 MG PO TB24
ORAL_TABLET | Freq: Every day | ORAL | 3 refills | Status: DC
  Filled 2021-12-10 (×2): qty 90, 90d supply, fill #0
  Filled 2022-03-04: qty 90, 90d supply, fill #1
  Filled 2022-06-03: qty 90, 90d supply, fill #2
  Filled 2022-08-24: qty 90, 90d supply, fill #3

## 2021-12-11 ENCOUNTER — Other Ambulatory Visit (HOSPITAL_COMMUNITY): Payer: Self-pay

## 2021-12-24 DIAGNOSIS — Z1231 Encounter for screening mammogram for malignant neoplasm of breast: Secondary | ICD-10-CM | POA: Diagnosis not present

## 2021-12-25 ENCOUNTER — Encounter (HOSPITAL_BASED_OUTPATIENT_CLINIC_OR_DEPARTMENT_OTHER): Payer: Self-pay | Admitting: *Deleted

## 2022-01-29 ENCOUNTER — Ambulatory Visit (INDEPENDENT_AMBULATORY_CARE_PROVIDER_SITE_OTHER): Payer: 59 | Admitting: Obstetrics & Gynecology

## 2022-01-29 ENCOUNTER — Other Ambulatory Visit (HOSPITAL_BASED_OUTPATIENT_CLINIC_OR_DEPARTMENT_OTHER): Payer: Self-pay

## 2022-01-29 ENCOUNTER — Encounter (HOSPITAL_BASED_OUTPATIENT_CLINIC_OR_DEPARTMENT_OTHER): Payer: Self-pay | Admitting: Obstetrics & Gynecology

## 2022-01-29 VITALS — BP 148/99 | HR 65 | Ht 63.0 in | Wt 155.2 lb

## 2022-01-29 DIAGNOSIS — Z01411 Encounter for gynecological examination (general) (routine) with abnormal findings: Secondary | ICD-10-CM

## 2022-01-29 DIAGNOSIS — Z1211 Encounter for screening for malignant neoplasm of colon: Secondary | ICD-10-CM

## 2022-01-29 DIAGNOSIS — Z87410 Personal history of cervical dysplasia: Secondary | ICD-10-CM | POA: Diagnosis not present

## 2022-01-29 DIAGNOSIS — M8580 Other specified disorders of bone density and structure, unspecified site: Secondary | ICD-10-CM

## 2022-01-29 DIAGNOSIS — Z780 Asymptomatic menopausal state: Secondary | ICD-10-CM | POA: Diagnosis not present

## 2022-01-29 DIAGNOSIS — B009 Herpesviral infection, unspecified: Secondary | ICD-10-CM | POA: Diagnosis not present

## 2022-01-29 DIAGNOSIS — L989 Disorder of the skin and subcutaneous tissue, unspecified: Secondary | ICD-10-CM

## 2022-01-29 DIAGNOSIS — Z9189 Other specified personal risk factors, not elsewhere classified: Secondary | ICD-10-CM | POA: Diagnosis not present

## 2022-01-29 MED ORDER — ESTRADIOL 0.1 MG/GM VA CREA
TOPICAL_CREAM | VAGINAL | 3 refills | Status: DC
  Filled 2022-01-29: qty 42.5, 30d supply, fill #0
  Filled 2022-06-03: qty 42.5, 90d supply, fill #1

## 2022-01-29 MED ORDER — DOXYCYCLINE HYCLATE 100 MG PO CAPS
100.0000 mg | ORAL_CAPSULE | Freq: Two times a day (BID) | ORAL | 0 refills | Status: DC
  Filled 2022-01-29: qty 14, 7d supply, fill #0

## 2022-01-29 MED ORDER — FLUCONAZOLE 150 MG PO TABS
ORAL_TABLET | ORAL | 0 refills | Status: DC
  Filled 2022-01-29: qty 4, 28d supply, fill #0

## 2022-01-29 MED ORDER — PREVNAR 20 0.5 ML IM SUSY
PREFILLED_SYRINGE | INTRAMUSCULAR | 0 refills | Status: DC
  Filled 2022-01-29: qty 0.5, 1d supply, fill #0

## 2022-01-29 MED ORDER — VALACYCLOVIR HCL 1 G PO TABS
ORAL_TABLET | Freq: Every day | ORAL | 3 refills | Status: DC
  Filled 2022-01-29 – 2022-03-04 (×2): qty 90, 90d supply, fill #0
  Filled 2022-06-03: qty 90, 90d supply, fill #1
  Filled 2022-08-24: qty 90, 90d supply, fill #2
  Filled 2022-11-25 – 2022-12-06 (×2): qty 90, 90d supply, fill #3

## 2022-01-29 NOTE — Progress Notes (Signed)
71 y.o. [id number] Married White or Caucasian female here for breast and pelvic exam.  H/o CIN 3 with cervical conation 1984.  Denies vaginal bleeding.  Does use vaginal estrogen cream for dryness and the is working.  Needs RF. ? ?Uses valtrex for HSV hx.  Needs refill as well.  No new issues. ? ?H/o hypertension.  Having some increased labial hypertension.  Recent 121/86 and 117/85 and 119/85.  Is on metoprolol $RemoveBefor'50mg'EySDhoXSgXoa$ .  Had coronary CT with coronary calcium score of 0.   ? ?Did lab work on [employer]. ? ?Has lesion on back of right thigh that is very itching and having mild clear drainage.  Scratched at it a few times at night.  Noticed this week it was larger than she thought.  Is really itchy.  Has been using some topical products. ? ?Patient's last menstrual period was 10/05/2003.          ?Sexually active: Yes.    ?H/O STD:  h/o HSV 2 ? ?Health Maintenance: ?PCP:  none   ?Vaccines are up to date:  shingles and pneumonia has not een completed ?Colonoscopy:  cologuard 2020 ?MMG:  12/24/2021 Negative ?BMD:  08/10/2018, -2.0 ?Last pap smear:  05/14/2016 Negative.   ?H/o abnormal pap smear:  hx of CIN 3.   ? ? ? reports that she has never smoked. She has never used smokeless tobacco. She reports current alcohol use of about 6.0 standard drinks per week. She reports that she does not use drugs. ? ?Past Medical History:  ?Diagnosis Date  ? History of abnormal Pap smear   ? CIN III, cervical cone 1984  ? History of subacute thyroiditis   ? resolved by itself  ? HSV-2 infection   ? Osteopenia   ? ? ?Past Surgical History:  ?Procedure Laterality Date  ? CERVICAL CONIZATION W/BX  1984  ? CIN III  ? CESAREAN SECTION  [phone]  ? SALPINGECTOMY Right   ? ectopic pregnancy  ? TUBAL LIGATION Bilateral 1988  ? reversal in 1991  ? WRIST SURGERY Left   ? ? ?Current Outpatient Medications  ?Medication Sig Dispense Refill  ? COVID-19 At Home Antigen Test (CARESTART COVID-19 HOME TEST) KIT Use as directed on package 4 kit 0  ?  estradiol (ESTRACE) 0.1 MG/GM vaginal cream APPLY 1 GRAM VAGINALLY 2 TIMES WEEKLY 42.5 g 3  ? metoprolol succinate (TOPROL-XL) 25 MG 24 hr tablet TAKE 1 TABLET BY MOUTH ONCE DAILY 90 tablet 3  ? valACYclovir (VALTREX) 1000 MG tablet TAKE 1 TABLET BY MOUTH DAILY 90 tablet 3  ? ?No current facility-administered medications for this visit.  ? ? ?Family History  ?Problem Relation Age of Onset  ? Lung cancer Mother   ? Hypertension Mother   ? Thyroid nodules Mother   ? Stroke Father   ?     embolic, complications from a-fib  ? ? ?Review of Systems  ?All other systems reviewed and are negative. ? ?Exam:   ?BP (!) 148/99 (BP Location: Left Arm, Patient Position: Sitting, Cuff Size: Large)   Pulse 65   Ht $R'5\' 3"'Ye$  (1.6 m) Comment: reported  Wt 155 lb 3.2 oz (70.4 kg)   LMP 10/05/2003   BMI 27.49 kg/m?   Height: $Remove'5\' 3"'uFNhAeW$  (160 cm) (reported) ? ?General appearance: alert, cooperative and appears stated age ?Breasts: normal appearance, no masses or tenderness ?Abdomen: soft, non-tender; bowel sounds normal; no masses,  no organomegaly ?Lymph nodes: Cervical, supraclavicular, and axillary nodes normal.  No abnormal inguinal  nodes palpated ?Neurologic: Grossly normal ?Thigh: dime size open lesion with surrounding erythema, clear drainage ? ?Pelvic: External genitalia:  no lesions ?             Urethra:  normal appearing urethra with no masses, tenderness or lesions ?             Bartholins and Skenes: normal    ?             Vagina: normal appearing vagina with atrophic changes and no discharge, no lesions ?             Cervix: no lesions ?             Pap taken: No. ?Bimanual Exam:  Uterus:  normal size, contour, position, consistency, mobility, non-tender ?             Adnexa: no mass, fullness, tenderness ?              Rectovaginal: Confirms ?              Anus:  normal sphincter tone, no lesions ? ?Chaperone, Octaviano Batty, CMA, was present for exam. ? ?Assessment/Plan: ?1. GYN exam for high-risk Medicare patient ?- pap  guidelines and emerging information discussed.  We decided together to no repeat pap smear today ?- MMG 12/2021 ?- cologuard neg 2020 ?- BMD 08/2018, plan to repeat next year ?- lab work done with [facility] labs ?- vaccines reviewed/updated.  Pt aware she needs pneumonia vaccinations and shingrix ? ?2. Postmenopausal ?- no oral HRT ?- estradiol (ESTRACE) 0.1 MG/GM vaginal cream; APPLY 1 GRAM VAGINALLY 2 TIMES WEEKLY  Dispense: 42.5 g; Refill: 3 ? ?3. HSV-2 infection ?- valACYclovir (VALTREX) 1000 MG tablet; TAKE 1 TABLET BY MOUTH DAILY  Dispense: 90 tablet; Refill: 3 ? ?4. Colon cancer screening ?- Cologuard ordered ? ?5. Osteopenia, unspecified location ?- plan BMD next year ? ?6. History of cervical dysplasia ? ?7.  Skin lesion ?- rx for diflucan and doxycyline sent to pharmacy on file ? ? ? ?

## 2022-02-09 ENCOUNTER — Other Ambulatory Visit (HOSPITAL_BASED_OUTPATIENT_CLINIC_OR_DEPARTMENT_OTHER): Payer: Self-pay

## 2022-03-04 ENCOUNTER — Other Ambulatory Visit (HOSPITAL_BASED_OUTPATIENT_CLINIC_OR_DEPARTMENT_OTHER): Payer: Self-pay

## 2022-05-04 ENCOUNTER — Encounter (HOSPITAL_BASED_OUTPATIENT_CLINIC_OR_DEPARTMENT_OTHER): Payer: Self-pay | Admitting: Obstetrics & Gynecology

## 2022-06-03 ENCOUNTER — Other Ambulatory Visit (HOSPITAL_BASED_OUTPATIENT_CLINIC_OR_DEPARTMENT_OTHER): Payer: Self-pay

## 2022-08-04 ENCOUNTER — Other Ambulatory Visit (HOSPITAL_BASED_OUTPATIENT_CLINIC_OR_DEPARTMENT_OTHER): Payer: Self-pay

## 2022-08-13 DIAGNOSIS — H0102B Squamous blepharitis left eye, upper and lower eyelids: Secondary | ICD-10-CM | POA: Diagnosis not present

## 2022-08-13 DIAGNOSIS — H0102A Squamous blepharitis right eye, upper and lower eyelids: Secondary | ICD-10-CM | POA: Diagnosis not present

## 2022-08-13 DIAGNOSIS — H2513 Age-related nuclear cataract, bilateral: Secondary | ICD-10-CM | POA: Diagnosis not present

## 2022-08-13 DIAGNOSIS — H40013 Open angle with borderline findings, low risk, bilateral: Secondary | ICD-10-CM | POA: Diagnosis not present

## 2022-08-24 ENCOUNTER — Other Ambulatory Visit (HOSPITAL_BASED_OUTPATIENT_CLINIC_OR_DEPARTMENT_OTHER): Payer: Self-pay

## 2022-10-11 ENCOUNTER — Telehealth: Payer: Self-pay | Admitting: General Practice

## 2022-10-11 NOTE — Telephone Encounter (Signed)
Not a pt of [facility]?  .Type of form received: FMLA  Additional comments:   Received by: Adonis Brook  Form should be Faxed to: [phone]  Form should be mailed to:    Is patient requesting call for pickup:   Form placed:  In provider's box  Attach charge sheet. yes  Individual made aware of 3-5 business day turn around (Y/N)?  No

## 2022-10-11 NOTE — Telephone Encounter (Signed)
Noted  

## 2022-11-24 ENCOUNTER — Other Ambulatory Visit: Payer: Self-pay | Admitting: Internal Medicine

## 2022-11-25 ENCOUNTER — Other Ambulatory Visit (HOSPITAL_BASED_OUTPATIENT_CLINIC_OR_DEPARTMENT_OTHER): Payer: Self-pay

## 2022-11-25 MED ORDER — METOPROLOL SUCCINATE ER 25 MG PO TB24
25.0000 mg | ORAL_TABLET | Freq: Every day | ORAL | 1 refills | Status: DC
  Filled 2022-11-25: qty 90, 90d supply, fill #0
  Filled 2023-02-19 (×2): qty 90, 90d supply, fill #1

## 2022-12-02 ENCOUNTER — Other Ambulatory Visit (HOSPITAL_BASED_OUTPATIENT_CLINIC_OR_DEPARTMENT_OTHER): Payer: Self-pay

## 2022-12-06 ENCOUNTER — Other Ambulatory Visit (HOSPITAL_BASED_OUTPATIENT_CLINIC_OR_DEPARTMENT_OTHER): Payer: Self-pay

## 2023-01-17 DIAGNOSIS — Z1231 Encounter for screening mammogram for malignant neoplasm of breast: Secondary | ICD-10-CM | POA: Diagnosis not present

## 2023-01-27 ENCOUNTER — Encounter (HOSPITAL_BASED_OUTPATIENT_CLINIC_OR_DEPARTMENT_OTHER): Payer: Self-pay | Admitting: *Deleted

## 2023-02-10 ENCOUNTER — Other Ambulatory Visit (HOSPITAL_BASED_OUTPATIENT_CLINIC_OR_DEPARTMENT_OTHER): Payer: Self-pay

## 2023-02-10 ENCOUNTER — Encounter (HOSPITAL_BASED_OUTPATIENT_CLINIC_OR_DEPARTMENT_OTHER): Payer: Self-pay | Admitting: Obstetrics & Gynecology

## 2023-02-10 ENCOUNTER — Ambulatory Visit (INDEPENDENT_AMBULATORY_CARE_PROVIDER_SITE_OTHER): Payer: Commercial Managed Care - PPO | Admitting: Obstetrics & Gynecology

## 2023-02-10 VITALS — BP 148/88 | HR 53 | Ht 62.5 in | Wt 156.6 lb

## 2023-02-10 DIAGNOSIS — M85851 Other specified disorders of bone density and structure, right thigh: Secondary | ICD-10-CM | POA: Diagnosis not present

## 2023-02-10 DIAGNOSIS — Z9189 Other specified personal risk factors, not elsewhere classified: Secondary | ICD-10-CM | POA: Diagnosis not present

## 2023-02-10 DIAGNOSIS — B009 Herpesviral infection, unspecified: Secondary | ICD-10-CM | POA: Diagnosis not present

## 2023-02-10 DIAGNOSIS — Z1211 Encounter for screening for malignant neoplasm of colon: Secondary | ICD-10-CM

## 2023-02-10 DIAGNOSIS — Z01419 Encounter for gynecological examination (general) (routine) without abnormal findings: Secondary | ICD-10-CM

## 2023-02-10 DIAGNOSIS — Z87410 Personal history of cervical dysplasia: Secondary | ICD-10-CM | POA: Diagnosis not present

## 2023-02-10 DIAGNOSIS — Z780 Asymptomatic menopausal state: Secondary | ICD-10-CM | POA: Diagnosis not present

## 2023-02-10 MED ORDER — VALACYCLOVIR HCL 1 G PO TABS
1000.0000 mg | ORAL_TABLET | Freq: Every day | ORAL | 3 refills | Status: DC
  Filled 2023-02-10: qty 90, fill #0
  Filled 2023-02-19 (×2): qty 90, 90d supply, fill #0
  Filled 2023-05-20: qty 90, 90d supply, fill #1
  Filled 2023-08-14: qty 90, 90d supply, fill #2
  Filled 2023-11-12: qty 90, 90d supply, fill #3

## 2023-02-10 NOTE — Progress Notes (Signed)
72 y.o. [id number] Widowed White or Caucasian female here for annual exam.  Working 1/2 time.  BP is mildly elevated today.  She is checking her blood pressure at home.  Did blood work for doctor's day.  Cholesterol was a little up but she had a normal coronary calcium score 2021-01-24.  Husband died in 09/26/23.  Of course, this has been a sad time.  Reports kids have been great.    Patient's last menstrual period was 10/05/2003.          The current method of family planning is post menopausal status.    Exercising: exercising at [employer] Smoker:  no  Health Maintenance: Pap:  not indicated History of abnormal Pap:  h/o CIN 111 MMG:  01/17/2023 Colonoscopy:  declined but cologuard order placed today BMD:   will plan to do this next year with MMG Screening Labs: done at doctor's day   reports that she has never smoked. She has never used smokeless tobacco. She reports current alcohol use of about 6.0 standard drinks of alcohol per week. She reports that she does not use drugs.  Past Medical History:  Diagnosis Date   History of abnormal Pap smear    CIN III, cervical cone 1984   History of subacute thyroiditis    resolved by itself   HSV-2 infection    Osteopenia     Past Surgical History:  Procedure Laterality Date   CERVICAL CONIZATION W/BX  1984   CIN III   CESAREAN SECTION  [phone]   SALPINGECTOMY Right    ectopic pregnancy   TUBAL LIGATION Bilateral 1988   reversal in 1991   WRIST SURGERY Left     Current Outpatient Medications  Medication Sig Dispense Refill   fluconazole (DIFLUCAN) 150 MG tablet Take 1 tab weekly x 4 weeks 4 tablet 0   metoprolol succinate (TOPROL-XL) 25 MG 24 hr tablet Take 1 tablet (25 mg total) by mouth daily. 90 tablet 1   pneumococcal 20-valent conjugate vaccine (PREVNAR 20) 0.5 ML injection Inject into the muscle. 0.5 mL 0   valACYclovir (VALTREX) 1000 MG tablet TAKE 1 TABLET BY MOUTH DAILY 90 tablet 3   No current facility-administered  medications for this visit.    Family History  Problem Relation Age of Onset   Lung cancer Mother    Hypertension Mother    Thyroid nodules Mother    Stroke Father        embolic, complications from a-fib    ROS: Constitutional: negative Genitourinary:negative  Exam:   BP (!) 148/88   Pulse (!) 53   Ht 5' 2.5" (1.588 m)   Wt 156 lb 9.6 oz (71 kg)   LMP 10/05/2003   BMI 28.19 kg/m   Height: 5' 2.5" (158.8 cm)  General appearance: alert, cooperative and appears stated age Head: Normocephalic, without obvious abnormality, atraumatic Neck: no adenopathy, supple, symmetrical, trachea midline and thyroid normal to inspection and palpation Lungs: clear to auscultation bilaterally Breasts: normal appearance, no masses or tenderness Heart: regular rate and rhythm Abdomen: soft, non-tender; bowel sounds normal; no masses,  no organomegaly Extremities: extremities normal, atraumatic, no cyanosis or edema Skin: Skin color, texture, turgor normal. No rashes or lesions Lymph nodes: Cervical, supraclavicular, and axillary nodes normal. No abnormal inguinal nodes palpated Neurologic: Grossly normal   Pelvic: External genitalia:  no lesions              Urethra:  normal appearing urethra with no masses, tenderness or lesions  Bartholins and Skenes: normal                 Vagina: normal appearing vagina with normal color and no discharge, no lesions              Cervix: no lesions              Pap taken: No. Bimanual Exam:  Uterus:  normal size, contour, position, consistency, mobility, non-tender              Adnexa: normal adnexa               Rectovaginal: Confirms               Anus:  normal sphincter tone, no lesions  Chaperone, Ina Homes, CMA, was present for exam.  Assessment/Plan: 1. GYN exam for high-risk Medicare patient - Pap smear ont obtained.  Guidelines reviewed. - Mammogram 01/2023 - Colonoscopy declined.  Cologuard ordered. - Bone mineral density  will order for next year - lab work done  - vaccines reviewed/updated  2. Colon cancer screening - Cologuard  3. HSV-2 infection - valACYclovir (VALTREX) 1000 MG tablet; Take 1 tablet (1,000 mg total) by mouth daily.  Dispense: 90 tablet; Refill: 3  4. Osteopenia of neck of right femur - plan next year  5. Postmenopausal - not on HRT  6. History of cervical dysplasia - in 1984

## 2023-02-19 ENCOUNTER — Other Ambulatory Visit (HOSPITAL_BASED_OUTPATIENT_CLINIC_OR_DEPARTMENT_OTHER): Payer: Self-pay

## 2023-05-20 ENCOUNTER — Other Ambulatory Visit (HOSPITAL_BASED_OUTPATIENT_CLINIC_OR_DEPARTMENT_OTHER): Payer: Self-pay

## 2023-05-20 ENCOUNTER — Encounter: Payer: Self-pay | Admitting: Internal Medicine

## 2023-05-20 ENCOUNTER — Other Ambulatory Visit: Payer: Self-pay

## 2023-05-20 ENCOUNTER — Ambulatory Visit: Payer: Commercial Managed Care - PPO | Attending: Internal Medicine

## 2023-05-20 ENCOUNTER — Other Ambulatory Visit (HOSPITAL_COMMUNITY): Payer: Self-pay

## 2023-05-20 ENCOUNTER — Other Ambulatory Visit: Payer: Self-pay | Admitting: Internal Medicine

## 2023-05-20 DIAGNOSIS — Z01810 Encounter for preprocedural cardiovascular examination: Secondary | ICD-10-CM

## 2023-05-20 DIAGNOSIS — Z13220 Encounter for screening for lipoid disorders: Secondary | ICD-10-CM

## 2023-05-20 DIAGNOSIS — Z79899 Other long term (current) drug therapy: Secondary | ICD-10-CM

## 2023-05-20 DIAGNOSIS — I422 Other hypertrophic cardiomyopathy: Secondary | ICD-10-CM

## 2023-05-20 DIAGNOSIS — R002 Palpitations: Secondary | ICD-10-CM

## 2023-05-20 MED ORDER — METOPROLOL SUCCINATE ER 25 MG PO TB24
25.0000 mg | ORAL_TABLET | Freq: Every day | ORAL | 0 refills | Status: DC
  Filled 2023-05-20: qty 30, 30d supply, fill #0

## 2023-05-20 MED ORDER — ROSUVASTATIN CALCIUM 5 MG PO TABS
5.0000 mg | ORAL_TABLET | Freq: Every day | ORAL | 3 refills | Status: DC
  Filled 2023-05-20: qty 90, 90d supply, fill #0

## 2023-05-20 NOTE — Progress Notes (Unsigned)
Enrolled patient for a 14 day Zio XT  monitor to be mailed to patients home  °

## 2023-05-20 NOTE — Telephone Encounter (Signed)
  1.  Re BP   It should be tighter    Re you taking b blocker?   Could add low dose ARB 2  Re palpitations.  Will set up for 2 wk zio given palpitations and MRI 3  Would set up for follow up MRI to reeval hypertrophy 4  LDL should be lower   Your Ca score was 0 but you had  very mild plaquing of aorta    Would try 5 of Crestor   Follow up lipomed, Lpa , apo B and liver panel in 8 wks.  6  Call when convenient   We can talk   I can set to see you as well   I am including Jeannett Senior ( I work with Dewayne Hatch) to this conversation

## 2023-05-21 ENCOUNTER — Other Ambulatory Visit (HOSPITAL_BASED_OUTPATIENT_CLINIC_OR_DEPARTMENT_OTHER): Payer: Self-pay

## 2023-05-25 DIAGNOSIS — R002 Palpitations: Secondary | ICD-10-CM | POA: Diagnosis not present

## 2023-05-25 DIAGNOSIS — Z13220 Encounter for screening for lipoid disorders: Secondary | ICD-10-CM | POA: Diagnosis not present

## 2023-05-25 DIAGNOSIS — Z79899 Other long term (current) drug therapy: Secondary | ICD-10-CM | POA: Diagnosis not present

## 2023-06-15 DIAGNOSIS — R002 Palpitations: Secondary | ICD-10-CM | POA: Diagnosis not present

## 2023-06-15 DIAGNOSIS — Z79899 Other long term (current) drug therapy: Secondary | ICD-10-CM | POA: Diagnosis not present

## 2023-06-18 ENCOUNTER — Other Ambulatory Visit: Payer: Self-pay | Admitting: Internal Medicine

## 2023-06-19 ENCOUNTER — Other Ambulatory Visit (HOSPITAL_BASED_OUTPATIENT_CLINIC_OR_DEPARTMENT_OTHER): Payer: Self-pay

## 2023-06-20 MED ORDER — METOPROLOL SUCCINATE ER 25 MG PO TB24
25.0000 mg | ORAL_TABLET | Freq: Every day | ORAL | 0 refills | Status: DC
  Filled 2023-06-20: qty 30, 30d supply, fill #0

## 2023-06-21 ENCOUNTER — Other Ambulatory Visit (HOSPITAL_BASED_OUTPATIENT_CLINIC_OR_DEPARTMENT_OTHER): Payer: Self-pay

## 2023-06-22 ENCOUNTER — Encounter: Payer: Self-pay | Admitting: Internal Medicine

## 2023-08-06 ENCOUNTER — Other Ambulatory Visit: Payer: Self-pay | Admitting: Internal Medicine

## 2023-08-08 ENCOUNTER — Other Ambulatory Visit (HOSPITAL_BASED_OUTPATIENT_CLINIC_OR_DEPARTMENT_OTHER): Payer: Self-pay

## 2023-08-08 MED ORDER — METOPROLOL SUCCINATE ER 25 MG PO TB24
25.0000 mg | ORAL_TABLET | Freq: Every day | ORAL | 0 refills | Status: DC
  Filled 2023-08-08: qty 15, 15d supply, fill #0

## 2023-08-09 ENCOUNTER — Encounter (HOSPITAL_COMMUNITY): Payer: Self-pay

## 2023-08-10 ENCOUNTER — Other Ambulatory Visit: Payer: Self-pay | Admitting: Internal Medicine

## 2023-08-10 ENCOUNTER — Ambulatory Visit (HOSPITAL_COMMUNITY)
Admission: RE | Admit: 2023-08-10 | Discharge: 2023-08-10 | Disposition: A | Discharge status: Home / Self Care | Payer: Commercial Managed Care - PPO | Source: Ambulatory Visit | Attending: Internal Medicine | Admitting: Internal Medicine

## 2023-08-10 DIAGNOSIS — Z01810 Encounter for preprocedural cardiovascular examination: Secondary | ICD-10-CM

## 2023-08-10 DIAGNOSIS — I422 Other hypertrophic cardiomyopathy: Secondary | ICD-10-CM | POA: Diagnosis present

## 2023-08-10 MED ORDER — GADOBUTROL 1 MMOL/ML IV SOLN
7.0000 mL | Freq: Once | INTRAVENOUS | Status: AC | PRN
  Administered 2023-08-10: 7 mL via INTRAVENOUS

## 2023-08-12 ENCOUNTER — Other Ambulatory Visit: Payer: Self-pay

## 2023-08-12 ENCOUNTER — Other Ambulatory Visit (HOSPITAL_BASED_OUTPATIENT_CLINIC_OR_DEPARTMENT_OTHER): Payer: Self-pay

## 2023-08-12 DIAGNOSIS — Z13220 Encounter for screening for lipoid disorders: Secondary | ICD-10-CM

## 2023-08-12 DIAGNOSIS — Z79899 Other long term (current) drug therapy: Secondary | ICD-10-CM

## 2023-08-12 MED ORDER — METOPROLOL SUCCINATE ER 25 MG PO TB24
25.0000 mg | ORAL_TABLET | Freq: Every day | ORAL | 3 refills | Status: DC
  Filled 2023-08-12 – 2023-08-20 (×5): qty 90, 90d supply, fill #0
  Filled 2023-11-12: qty 90, 90d supply, fill #1
  Filled 2024-01-26: qty 90, 90d supply, fill #2
  Filled 2024-04-18: qty 90, 90d supply, fill #3

## 2023-08-14 ENCOUNTER — Other Ambulatory Visit (HOSPITAL_BASED_OUTPATIENT_CLINIC_OR_DEPARTMENT_OTHER): Payer: Self-pay

## 2023-08-15 ENCOUNTER — Other Ambulatory Visit: Payer: Self-pay

## 2023-08-15 ENCOUNTER — Other Ambulatory Visit (HOSPITAL_BASED_OUTPATIENT_CLINIC_OR_DEPARTMENT_OTHER): Payer: Self-pay

## 2023-08-19 ENCOUNTER — Other Ambulatory Visit (HOSPITAL_BASED_OUTPATIENT_CLINIC_OR_DEPARTMENT_OTHER): Payer: Self-pay

## 2023-08-20 ENCOUNTER — Other Ambulatory Visit (HOSPITAL_BASED_OUTPATIENT_CLINIC_OR_DEPARTMENT_OTHER): Payer: Self-pay

## 2023-08-26 DIAGNOSIS — H0102A Squamous blepharitis right eye, upper and lower eyelids: Secondary | ICD-10-CM | POA: Diagnosis not present

## 2023-08-26 DIAGNOSIS — H40013 Open angle with borderline findings, low risk, bilateral: Secondary | ICD-10-CM | POA: Diagnosis not present

## 2023-08-26 DIAGNOSIS — H0102B Squamous blepharitis left eye, upper and lower eyelids: Secondary | ICD-10-CM | POA: Diagnosis not present

## 2023-08-26 DIAGNOSIS — H2513 Age-related nuclear cataract, bilateral: Secondary | ICD-10-CM | POA: Diagnosis not present

## 2023-10-28 NOTE — Progress Notes (Unsigned)
Cardiology Office Note   Date:  10/31/2023   ID:  Sarah [name], DOB 10-13-1950, MRN [id number]  Cardiologist:  Abbey Chatters, MD    Pt presents for follow up of palpitations      History of Present Illness: Sarah [name] is a 73 y.o. female with  hx of palpitations I saw her in 2020   Had a "buzzing sensation' at times in L parasternal area.    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  Also notes less buzzing   Denies CP     Current Meds  Medication Sig   metoprolol succinate (TOPROL-XL) 25 MG 24 hr tablet Take 1 tablet (25 mg total) by mouth daily.   valACYclovir (VALTREX) 1000 MG tablet Take 1 tablet (1,000 mg total) by mouth daily.   [DISCONTINUED] valsartan (DIOVAN) 160 MG tablet Take 160 mg by mouth daily.     Allergies:   Patient has no known allergies.   Past Medical History:  Diagnosis Date   History of abnormal Pap smear    CIN III, cervical cone 1984   History of subacute thyroiditis    resolved by itself   HSV-2 infection    Osteopenia     Past Surgical History:  Procedure Laterality Date   CERVICAL CONIZATION W/BX  1984   CIN III   CESAREAN SECTION  [phone]   SALPINGECTOMY Right    ectopic pregnancy   TUBAL LIGATION Bilateral 1988   reversal in 1991   WRIST  SURGERY Left      Social History:  The patient  reports that she has never smoked. She has never used smokeless tobacco. She reports current alcohol use of about 6.0 standard drinks of alcohol per week. She reports that she does not use drugs.   Family History:  The patient's family history includes Hypertension in her mother; Lung cancer in her mother; Stroke in her father; Thyroid nodules in her mother.    ROS:  Please see the history of present illness. All other systems are reviewed and  Negative to the above problem except as noted.    PHYSICAL EXAM: VS:  BP (!) 128/94   Pulse 61   Ht 5' 2.5" (1.588 m)   Wt 152 lb (68.9 kg)   LMP 10/05/2003   SpO2 98%   BMI 27.36 kg/m   GEN:Pt is in no acute distress  HEENT: normal  Neck: no JVD, carotid bruits Cardiac: RRR; no murmur   Respiratory:  clear to auscultation bilaterally,  GI: soft, nontender, no  masses  No hepatomegaly  MS: no deformity Moving all extremities   Skin: warm and dry, no rash Neuro:  Strength and sensation are intact Psych: euthymic mood, full affect   EKG:  EKG is ordered today.  SR  61 bpm    MRI Nov 2024  1. Normal left ventricular size, with LVEDD 42 mm, and LVEDVi 55 mL/m2.   Mild basal septal hypertrophy, 12 mm, and myocardial mass index of 43 g/m2. Her predicted regional wall thickness is 10 mm but the upper limit of normal for age, gender, and BSA is estimated to be 13 mm.   Normal left ventricular systolic function (LVEF =67%). There are no regional wall motion abnormalities. There is no supine systolic anterior motion of the mitral valve.   Left ventricular parametric mapping notable for normal ECV and T2.   There is no late gadolinium enhancement in the left ventricular myocardium.   2. Normal right ventricular size with RVEDVI 50 mL/m2.   Normal right ventricular thickness.   Normal right ventricular systolic function (RVEF =64%). There are no regional wall motion abnormalities or  aneurysms.   3.  Normal left and right atrial size.   4. Normal size of the aortic root and pulmonary artery. 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   5. Valve assessment:   Aortic Valve: Tri-leaflet aortic valve. There is no significant regurgitation.   Pulmonic Valve: There is no significant regurgitation.   Tricuspid Valve: There is mild regurgitation. Regurgitant fraction 7%.   Mitral Valve: There is no significant regurgitation.   6. Normal pericardium. Small pericardial effusion anterior to the right ventricular. No evidence of increased pericardial pressures.   7. Grossly, no extracardiac findings. Recommended dedicated study if concerned for non-cardiac pathology.   IMPRESSION: 1.  Study does not meet criteria for hypertrophic cardiomyopathy.   2.  LVEF 67%.   3. Mild ascending aortic dilation, 40 mm. Consider repeat imaging modality (CT/Echo/CMR) in one year if clinically indicated.    CCTA   April 2022  Coronary arteries: Normal origins.   Coronary Calcium Score:   Left main: 0   Left anterior descending artery: 0   Left circumflex artery: 0   Right coronary artery: 0   Total: 0   Percentile: 0   Pericardium: Normal.   Ascending Aorta: Normal caliber.   Non-cardiac: See separate report from [facility].   IMPRESSION: Coronary calcium score of 0. This was 0 percentile for age-, race-, and sex-matched controls.  Monitor  Sept 2024   Impression:  Sinus rhythm with rates 45 to 119 bpm  Average HR 65 bpm.   Rare PACs, PVCs.   23 bursts of SVT, fasts for 4 beats at 179 bpm, longest for 13 seconds (average rate 127 bpm)   Triggered events corresponded to SR with PAC vs SR with PVC   Diary entry corresponded to SR with PAC     Lipid Panel    Component Value Date/Time   CHOL 181 06/29/2019 1150   TRIG 54 06/29/2019 1150   HDL 69 06/29/2019 1150   CHOLHDL 2.6 06/29/2019 1150   LDLCALC 98 06/29/2019 1150      Wt Readings from  Last 3 Encounters:  10/31/23 152 lb (68.9 kg)  02/10/23 156 lb 9.6 oz (71 kg)  01/29/22 155 lb 3.2 oz (70.4 kg)      ASSESSMENT AND PLAN:  1  HTN   BP has been labile    She started  valsartan on own   BP doing better   SOmetimes diastolilc in 90s   At times in evening BP will be in 90s/60s   No dizziness  Actually has much few palpitations since starting valsartan    I would continue   Can take in am and take metoprolol in pm    Call if not controlled   2  Palpitations   Improved   Follow   Keep on current regimen   Increase activity   3  LIpids   REviewed CT from 2022    One calcified are in descending aorta    Off of statin now    I would check this spring    4  Abnormal echo   Concern for HOCM on previous echo   Current MRI does not s Support this    Will follow   Aorta was 40   Follow periodically in future with echo    Current medicines are reviewed at length with the patient today.  The patient does not have concerns regarding medicines.  Signed, Dietrich Pates, MD  10/31/2023 12:57 PM    [facility] [address] [place], [place], [place]  [address] Phone: [phone]; Fax: [phone]

## 2023-10-31 ENCOUNTER — Other Ambulatory Visit (HOSPITAL_BASED_OUTPATIENT_CLINIC_OR_DEPARTMENT_OTHER): Payer: Self-pay

## 2023-10-31 ENCOUNTER — Ambulatory Visit: Payer: Commercial Managed Care - PPO | Attending: Internal Medicine | Admitting: Internal Medicine

## 2023-10-31 ENCOUNTER — Encounter: Payer: Self-pay | Admitting: Internal Medicine

## 2023-10-31 VITALS — BP 128/94 | HR 61 | Ht 62.5 in | Wt 152.0 lb

## 2023-10-31 DIAGNOSIS — R002 Palpitations: Secondary | ICD-10-CM | POA: Diagnosis not present

## 2023-10-31 DIAGNOSIS — I422 Other hypertrophic cardiomyopathy: Secondary | ICD-10-CM | POA: Diagnosis not present

## 2023-10-31 DIAGNOSIS — Z79899 Other long term (current) drug therapy: Secondary | ICD-10-CM | POA: Diagnosis not present

## 2023-10-31 MED ORDER — VALSARTAN 160 MG PO TABS
160.0000 mg | ORAL_TABLET | Freq: Every day | ORAL | 3 refills | Status: DC
  Filled 2023-10-31: qty 90, 90d supply, fill #0
  Filled 2024-01-26: qty 90, 90d supply, fill #1
  Filled 2024-04-18: qty 90, 90d supply, fill #2
  Filled 2024-07-19: qty 90, 90d supply, fill #3

## 2023-10-31 NOTE — Patient Instructions (Signed)
Medication Instructions:   *If you need a refill on your cardiac medications before your next appointment, please call your pharmacy*   Lab Work: ADDED TO PREVIOUS LABS... CMET  If you have labs (blood work) drawn today and your tests are completely normal, you will receive your results only by: MyChart Message (if you have MyChart) OR A paper copy in the mail If you have any lab test that is abnormal or we need to change your treatment, we will call you to review the results.   Testing/Procedures:    Follow-Up: At [facility], you and your health needs are our priority.  As part of our continuing mission to provide you with exceptional heart care, we have created designated Provider Care Teams.  These Care Teams include your primary Cardiologist (physician) and Advanced Practice Providers (APPs -  Physician Assistants and Nurse Practitioners) who all work together to provide you with the care you need, when you need it.  We recommend signing up for the patient portal called "MyChart".  Sign up information is provided on this After Visit Summary.  MyChart is used to connect with patients for Virtual Visits (Telemedicine).  Patients are able to view lab/test results, encounter notes, upcoming appointments, etc.  Non-urgent messages can be sent to your provider as well.   To learn more about what you can do with MyChart, go to [email or web address].    Your next appointment:  ONE YEAR

## 2024-02-02 DIAGNOSIS — Z1231 Encounter for screening mammogram for malignant neoplasm of breast: Secondary | ICD-10-CM | POA: Diagnosis not present

## 2024-02-03 ENCOUNTER — Encounter (HOSPITAL_BASED_OUTPATIENT_CLINIC_OR_DEPARTMENT_OTHER): Payer: Self-pay | Admitting: Obstetrics & Gynecology

## 2024-02-17 ENCOUNTER — Other Ambulatory Visit (HOSPITAL_COMMUNITY)
Admission: RE | Admit: 2024-02-17 | Discharge: 2024-02-17 | Disposition: A | Discharge status: Home / Self Care | Source: Ambulatory Visit | Attending: Obstetrics & Gynecology | Admitting: Obstetrics & Gynecology

## 2024-02-17 ENCOUNTER — Ambulatory Visit (HOSPITAL_BASED_OUTPATIENT_CLINIC_OR_DEPARTMENT_OTHER): Payer: Commercial Managed Care - PPO | Admitting: Obstetrics & Gynecology

## 2024-02-17 ENCOUNTER — Encounter (HOSPITAL_BASED_OUTPATIENT_CLINIC_OR_DEPARTMENT_OTHER): Payer: Self-pay | Admitting: Obstetrics & Gynecology

## 2024-02-17 VITALS — BP 133/91 | HR 63 | Ht 62.84 in | Wt 153.0 lb

## 2024-02-17 DIAGNOSIS — Z124 Encounter for screening for malignant neoplasm of cervix: Secondary | ICD-10-CM | POA: Diagnosis not present

## 2024-02-17 DIAGNOSIS — B009 Herpesviral infection, unspecified: Secondary | ICD-10-CM | POA: Diagnosis not present

## 2024-02-17 DIAGNOSIS — Z87410 Personal history of cervical dysplasia: Secondary | ICD-10-CM | POA: Diagnosis not present

## 2024-02-17 DIAGNOSIS — Z780 Asymptomatic menopausal state: Secondary | ICD-10-CM | POA: Diagnosis not present

## 2024-02-17 DIAGNOSIS — Z1211 Encounter for screening for malignant neoplasm of colon: Secondary | ICD-10-CM

## 2024-02-17 DIAGNOSIS — M8588 Other specified disorders of bone density and structure, other site: Secondary | ICD-10-CM

## 2024-02-17 DIAGNOSIS — Z01419 Encounter for gynecological examination (general) (routine) without abnormal findings: Secondary | ICD-10-CM

## 2024-02-17 DIAGNOSIS — M8580 Other specified disorders of bone density and structure, unspecified site: Secondary | ICD-10-CM | POA: Diagnosis not present

## 2024-02-17 NOTE — Progress Notes (Signed)
   ANNUAL EXAM Patient name: Sarah [name] MRN [id number]  Date of birth: 1950-11-13 Chief Complaint:   AEX  History of Present Illness:   Sarah [name] is a 73 y.o. [phone] Caucasian female being seen today for a routine annual exam.  Doing well.  Denies vaginal bleeding.  Working 3 days a week.     Patient's last menstrual period was 10/05/2003.   Last pap: 2017 . Results were: NILM w/ HRHPV negative. H/O abnormal pap: yes : CIN 3 treated with conization 1084 Last mammogram: 02/03/2024 . Results were: normal. Family h/o breast cancer: no Last colonoscopy:  declined.  Cologuard 2020.       02/10/2023    1:56 PM 01/29/2022    9:52 AM 01/23/2021   10:04 AM  Depression screen PHQ 2/9  Decreased Interest 0 0 0  Down, Depressed, Hopeless 0 0 0  PHQ - 2 Score 0 0 0     Review of Systems:   Pertinent items are noted in HPI Denies any headaches, blurred vision, fatigue, shortness of breath, chest pain, abdominal pain, abnormal vaginal discharge/itching/odor/irritation, problems with periods, bowel movements, urination, or intercourse unless otherwise stated above. Pertinent History Reviewed:  Reviewed past medical,surgical, social and family history.  Reviewed problem list, medications and allergies. Physical Assessment:   Vitals:   02/17/24 0921  BP: (!) 133/91  Pulse: 63  Weight: 153 lb (69.4 kg)  Height: 5' 2.84" (1.596 m)  Body mass index is 27.25 kg/m.        Physical Examination:   General appearance - well appearing, and in no distress  Mental status - alert, oriented to person, place, and time  Psych:  She has a normal mood and affect  Skin - warm and dry, normal color, no suspicious lesions noted  Chest - effort normal, all lung fields clear to auscultation bilaterally  Heart - normal rate and regular rhythm  Neck:  midline trachea, no thyromegaly or nodules  Breasts - breasts appear normal, no suspicious masses, no skin or nipple changes or  axillary  nodes  Abdomen - soft, nontender, nondistended, no masses or organomegaly  Pelvic - VULVA: normal appearing vulva with no masses, tenderness or lesions  VAGINA: normal appearing vagina with normal color and discharge, no lesions  CERVIX: normal appearing cervix without discharge or lesions, no CMT  Thin prep pap is done with HR HPV cotesting  UTERUS: uterus is felt to be normal size, shape, consistency and nontender   ADNEXA: No adnexal masses or tenderness noted.  Rectal - normal rectal, good sphincter tone, no masses felt.  Extremities:  No swelling or varicosities noted  Chaperone present for exam  Assessment & Plan:  1. Well woman exam with routine gynecological exam (Primary) - Pap smear obtained today - Mammogram 02/2024 - Colonoscopy declined.  Will check into cologuard result from the last - Bone mineral density 2019 - lab work done with doctor's day labs.  She brought copies today. - vaccines reviewed/updated  2. Cervical cancer screening - Cytology - PAP( [place])  3. Colon cancer screening - cologuard ordered.   4. HSV-2 infection - does not need rx for this at this time  5. Osteopenia, spine - DEXA ordered  6. Postmenopausal    Meds: No orders of the defined types were placed in this encounter.   Follow-up: Return in about 1 year (around 02/16/2025).  Lillian Rein, MD 02/17/2024 10:22 AM

## 2024-02-21 LAB — CYTOLOGY - PAP
Adequacy: ABSENT
Comment: NEGATIVE
Diagnosis: NEGATIVE
High risk HPV: NEGATIVE

## 2024-02-22 ENCOUNTER — Ambulatory Visit (HOSPITAL_BASED_OUTPATIENT_CLINIC_OR_DEPARTMENT_OTHER): Payer: Self-pay | Admitting: Obstetrics & Gynecology

## 2024-02-24 DIAGNOSIS — R002 Palpitations: Secondary | ICD-10-CM | POA: Diagnosis not present

## 2024-02-24 DIAGNOSIS — Z79899 Other long term (current) drug therapy: Secondary | ICD-10-CM | POA: Diagnosis not present

## 2024-02-24 DIAGNOSIS — I422 Other hypertrophic cardiomyopathy: Secondary | ICD-10-CM | POA: Diagnosis not present

## 2024-02-24 LAB — COMPREHENSIVE METABOLIC PANEL WITH GFR
ALT: 38 IU/L — ABNORMAL HIGH (ref 0–32)
AST: 41 IU/L — ABNORMAL HIGH (ref 0–40)
Albumin: 4.4 g/dL (ref 3.8–4.8)
Alkaline Phosphatase: 74 IU/L (ref 44–121)
BUN/Creatinine Ratio: 26 (ref 12–28)
BUN: 24 mg/dL (ref 8–27)
Bilirubin Total: 0.4 mg/dL (ref 0.0–1.2)
CO2: 20 mmol/L (ref 20–29)
Calcium: 9.2 mg/dL (ref 8.7–10.3)
Chloride: 103 mmol/L (ref 96–106)
Creatinine, Ser: 0.93 mg/dL (ref 0.57–1.00)
Globulin, Total: 2.4 g/dL (ref 1.5–4.5)
Glucose: 74 mg/dL (ref 70–99)
Potassium: 5 mmol/L (ref 3.5–5.2)
Sodium: 139 mmol/L (ref 134–144)
Total Protein: 6.8 g/dL (ref 6.0–8.5)
eGFR: 65 mL/min/{1.73_m2} (ref >59)

## 2024-02-28 ENCOUNTER — Ambulatory Visit: Payer: Self-pay | Admitting: Internal Medicine

## 2024-02-28 DIAGNOSIS — Z13220 Encounter for screening for lipoid disorders: Secondary | ICD-10-CM

## 2024-02-28 DIAGNOSIS — Z79899 Other long term (current) drug therapy: Secondary | ICD-10-CM

## 2024-02-29 ENCOUNTER — Telehealth (HOSPITAL_BASED_OUTPATIENT_CLINIC_OR_DEPARTMENT_OTHER): Payer: Self-pay | Admitting: *Deleted

## 2024-02-29 NOTE — Telephone Encounter (Signed)
-----   Message from Lillian Rein sent at 02/17/2024 10:03 AM EDT ----- Regarding: can you call [employer] about cologuard? Can you call [employer]?  She swears she sent it off last year.  This is Dr. Ethel Henry.    Ottie Blonder

## 2024-02-29 NOTE — Telephone Encounter (Signed)
 Called pt to let her know that I reached out to [employer] about the cologuard test that she had sent in last year. They reported that they received the test after the order had expired and therefore they did not result it. Advised that a new test kit had been sent to her house. Pt verifies receipt.

## 2024-03-16 ENCOUNTER — Encounter (HOSPITAL_BASED_OUTPATIENT_CLINIC_OR_DEPARTMENT_OTHER): Payer: Self-pay | Admitting: *Deleted

## 2024-04-12 DIAGNOSIS — M8588 Other specified disorders of bone density and structure, other site: Secondary | ICD-10-CM | POA: Diagnosis not present

## 2024-04-19 ENCOUNTER — Encounter (HOSPITAL_BASED_OUTPATIENT_CLINIC_OR_DEPARTMENT_OTHER): Payer: Self-pay | Admitting: Obstetrics & Gynecology

## 2024-04-19 NOTE — Progress Notes (Signed)
 Scanned document

## 2024-05-11 ENCOUNTER — Ambulatory Visit (HOSPITAL_BASED_OUTPATIENT_CLINIC_OR_DEPARTMENT_OTHER): Payer: Self-pay | Admitting: Obstetrics & Gynecology

## 2024-07-19 ENCOUNTER — Other Ambulatory Visit (HOSPITAL_BASED_OUTPATIENT_CLINIC_OR_DEPARTMENT_OTHER): Payer: Self-pay | Admitting: Obstetrics & Gynecology

## 2024-07-19 ENCOUNTER — Other Ambulatory Visit (HOSPITAL_BASED_OUTPATIENT_CLINIC_OR_DEPARTMENT_OTHER): Payer: Self-pay

## 2024-07-19 ENCOUNTER — Other Ambulatory Visit: Payer: Self-pay | Admitting: Internal Medicine

## 2024-07-19 DIAGNOSIS — Z780 Asymptomatic menopausal state: Secondary | ICD-10-CM

## 2024-07-19 DIAGNOSIS — B009 Herpesviral infection, unspecified: Secondary | ICD-10-CM

## 2024-07-19 MED ORDER — ESTRADIOL 0.01 % VA CREA
1.0000 g | TOPICAL_CREAM | VAGINAL | 3 refills | Status: AC
  Filled 2024-07-19: qty 42.5, 90d supply, fill #0

## 2024-07-19 MED ORDER — VALACYCLOVIR HCL 1 G PO TABS
1000.0000 mg | ORAL_TABLET | Freq: Every day | ORAL | 3 refills | Status: AC
  Filled 2024-07-19: qty 90, 90d supply, fill #0
  Filled 2024-10-12: qty 90, 90d supply, fill #1

## 2024-07-20 ENCOUNTER — Other Ambulatory Visit (HOSPITAL_BASED_OUTPATIENT_CLINIC_OR_DEPARTMENT_OTHER): Payer: Self-pay

## 2024-07-20 ENCOUNTER — Other Ambulatory Visit: Payer: Self-pay

## 2024-07-20 MED ORDER — METOPROLOL SUCCINATE ER 25 MG PO TB24
25.0000 mg | ORAL_TABLET | Freq: Every day | ORAL | 3 refills | Status: AC
  Filled 2024-07-20: qty 90, 90d supply, fill #0

## 2024-07-25 LAB — HEPATIC FUNCTION PANEL
ALT: 32 IU/L (ref 0–32)
AST: 32 IU/L (ref 0–40)
Albumin: 4.1 g/dL (ref 3.8–4.8)
Alkaline Phosphatase: 66 IU/L (ref 49–135)
Bilirubin Total: 0.7 mg/dL (ref 0.0–1.2)
Bilirubin, Direct: 0.2 mg/dL (ref 0.00–0.40)
Total Protein: 6.5 g/dL (ref 6.0–8.5)

## 2024-07-25 LAB — NMR, LIPOPROFILE
Cholesterol, Total: 195 mg/dL (ref 100–199)
HDL Particle Number: 34.3 umol/L (ref >=30.5)
HDL-C: 69 mg/dL (ref >39)
LDL Particle Number: 1023 nmol/L — AB (ref <1000)
LDL Size: 22.4 nm (ref >20.5)
LDL-C (NIH Calc): 111 mg/dL — AB (ref 0–99)
LP-IR Score: 25 (ref <=45)
Small LDL Particle Number: 126 nmol/L (ref <=527)
Triglycerides: 82 mg/dL (ref 0–149)

## 2024-08-28 ENCOUNTER — Encounter: Payer: Self-pay | Admitting: Internal Medicine

## 2024-09-07 DIAGNOSIS — H2512 Age-related nuclear cataract, left eye: Secondary | ICD-10-CM | POA: Diagnosis not present

## 2024-09-07 DIAGNOSIS — H25811 Combined forms of age-related cataract, right eye: Secondary | ICD-10-CM | POA: Diagnosis not present

## 2024-09-07 DIAGNOSIS — H40013 Open angle with borderline findings, low risk, bilateral: Secondary | ICD-10-CM | POA: Diagnosis not present

## 2024-09-07 DIAGNOSIS — H0102A Squamous blepharitis right eye, upper and lower eyelids: Secondary | ICD-10-CM | POA: Diagnosis not present

## 2024-09-07 DIAGNOSIS — H0102B Squamous blepharitis left eye, upper and lower eyelids: Secondary | ICD-10-CM | POA: Diagnosis not present

## 2024-10-12 ENCOUNTER — Other Ambulatory Visit (HOSPITAL_BASED_OUTPATIENT_CLINIC_OR_DEPARTMENT_OTHER): Payer: Self-pay

## 2024-10-12 ENCOUNTER — Other Ambulatory Visit: Payer: Self-pay | Admitting: Internal Medicine

## 2024-10-12 ENCOUNTER — Other Ambulatory Visit: Payer: Self-pay

## 2024-10-12 MED ORDER — VALSARTAN 160 MG PO TABS
160.0000 mg | ORAL_TABLET | Freq: Every day | ORAL | 3 refills | Status: AC
  Filled 2024-10-12: qty 90, 90d supply, fill #0

## 2025-01-03 ENCOUNTER — Ambulatory Visit: Admitting: Internal Medicine

## 2025-02-22 ENCOUNTER — Ambulatory Visit (HOSPITAL_BASED_OUTPATIENT_CLINIC_OR_DEPARTMENT_OTHER): Admitting: Obstetrics & Gynecology
